# Patient Record
Sex: Male | Born: 1995 | Race: Black or African American | Hispanic: No | Marital: Single | Smoking: Never smoker
Health system: Southern US, Community
[De-identification: ages and names within clinical notes are randomized; demographics above are authoritative.]

---

## 2014-07-28 ENCOUNTER — Ambulatory Visit (INDEPENDENT_AMBULATORY_CARE_PROVIDER_SITE_OTHER): Payer: PRIVATE HEALTH INSURANCE | Admitting: Family Medicine

## 2014-07-28 VITALS — BP 122/72 | HR 70 | Temp 99.2°F | Resp 17 | Ht 68.5 in | Wt 163.0 lb

## 2014-07-28 DIAGNOSIS — Z23 Encounter for immunization: Secondary | ICD-10-CM

## 2014-07-28 DIAGNOSIS — Z0000 Encounter for general adult medical examination without abnormal findings: Secondary | ICD-10-CM

## 2014-07-28 LAB — POCT URINALYSIS DIPSTICK
BILIRUBIN UA: NEGATIVE
Blood, UA: NEGATIVE
GLUCOSE UA: NEGATIVE
Ketones, UA: NEGATIVE
Leukocytes, UA: NEGATIVE
NITRITE UA: NEGATIVE
Protein, UA: NEGATIVE
SPEC GRAV UA: 1.025
UROBILINOGEN UA: 0.2
pH, UA: 6.5

## 2014-07-28 LAB — POCT CBC
GRANULOCYTE PERCENT: 46.1 % (ref 37–80)
HCT, POC: 44.9 % (ref 43.5–53.7)
Hemoglobin: 15.2 g/dL (ref 14.1–18.1)
LYMPH, POC: 2.8 (ref 0.6–3.4)
MCH: 30.5 pg (ref 27–31.2)
MCHC: 33.9 g/dL (ref 31.8–35.4)
MCV: 89.8 fL (ref 80–97)
MID (cbc): 0.2 (ref 0–0.9)
MPV: 6.7 fL (ref 0–99.8)
POC Granulocyte: 2.5 (ref 2–6.9)
POC LYMPH PERCENT: 50.8 %L — AB (ref 10–50)
POC MID %: 3.1 %M (ref 0–12)
Platelet Count, POC: 231 10*3/uL (ref 142–424)
RBC: 5 M/uL (ref 4.69–6.13)
RDW, POC: 12.8 %
WBC: 5.5 10*3/uL (ref 4.6–10.2)

## 2014-07-28 NOTE — Patient Instructions (Addendum)
It was good to meet you today I completed your physical exam form.   Please come in to have your TB skin test (the shot that you got on your arm) read as instructed   We do not have any immunization (shots or vaccines) information for you Please look through any paperwork you may have from when you came to the [place]- you may well find records of shots there Please speak to the health office at [facility].  You may be required to have some immunizations prior to starting school  You got a Tdap, MMR and Hepatitis B vaccine today.  We do not have polio vaccine here- if you need to get this done please contact the Health Department   Children and Adults: [place] and [place]: [phone] (Information in Spanish available) Refugee and Immigrants: [phone]  In [place], we are located at [address]. Please check in at the front desk. In [place], we are located at [address]. Please check in at the front desk.    

## 2014-07-28 NOTE — Addendum Note (Signed)
Addended by: Abbe AmsterdamOPLAND, Armetta Henri C on: 07/28/2014 02:06 PM   Modules accepted: Orders

## 2014-07-28 NOTE — Progress Notes (Addendum)
[facility] [address], [place] [address] [phone]  Date:  07/28/2014   Name:  James Baird   DOB:  10-Jul-1995   MRN:  [id number]  PCP:  No primary care provider on file.    Chief Complaint: Annual Exam   History of Present Illness:  James Baird is a 19 y.o. very pleasant male patient who presents with the following:  Generally healthy young lady man here today seeking a PE for [employer]. He plans to study engineering. He has been in the [place] for about 6 months- he is from [place]. He does not really have any shot records on him but knows that he got some shots as a baby and again when he came to the [place].  However when I asked him again later he did not seem to know if he had an immunizations upon arrival to the [place] He thinks he had BCG vaccine as a newborn  There are no active problems to display for this patient.   No past medical history on file.  No past surgical history on file.  History  Substance Use Topics  . Smoking status: Never Smoker   . Smokeless tobacco: Not on file  . Alcohol Use: Not on file    No family history on file.  No Known Allergies  Medication list has been reviewed and updated.  No current outpatient prescriptions on file prior to visit.   No current facility-administered medications on file prior to visit.    Review of Systems:  As per HPI- otherwise negative.   Physical Examination: Filed Vitals:   07/28/14 1020  BP: 122/72  Pulse: 70  Temp: 99.2 F (37.3 C)  Resp: 17   Filed Vitals:   07/28/14 1020  Height: 5' 8.5" (1.74 m)  Weight: 163 lb (73.936 kg)   Body mass index is 24.42 kg/(m^2). Ideal Body Weight: Weight in (lb) to have BMI = 25: 166.5  GEN: WDWN, NAD, Non-toxic, A & O x 3 HEENT: Atraumatic, Normocephalic. Neck supple. No masses, No LAD. Ears and Nose: No external deformity. CV: RRR, No M/G/R. No JVD. No thrill. No extra heart sounds. PULM: CTA B, no wheezes, crackles,  rhonchi. No retractions. No resp. distress. No accessory muscle use. ABD: S, NT, ND, +BS. No rebound. No HSM. EXTR: No c/c/e NEURO Normal gait.  PSYCH: Normally interactive. Conversant. Not depressed or anxious appearing.  Calm demeanor.   Results for orders placed or performed in visit on 07/28/14  POCT CBC  Result Value Ref Range   WBC 5.5 4.6 - 10.2 K/uL   Lymph, poc 2.8 0.6 - 3.4   POC LYMPH PERCENT 50.8 (A) 10 - 50 %L   MID (cbc) 0.2 0 - 0.9   POC MID % 3.1 0 - 12 %M   POC Granulocyte 2.5 2 - 6.9   Granulocyte percent 46.1 37 - 80 %G   RBC 5.00 4.69 - 6.13 M/uL   Hemoglobin 15.2 14.1 - 18.1 g/dL   HCT, POC 44.9 43.5 - 53.7 %   MCV 89.8 80 - 97 fL   MCH, POC 30.5 27 - 31.2 pg   MCHC 33.9 31.8 - 35.4 g/dL   RDW, POC 12.8 %   Platelet Count, POC 231 142 - 424 K/uL   MPV 6.7 0 - 99.8 fL  POCT urinalysis dipstick  Result Value Ref Range   Color, UA yellow    Clarity, UA clear    Glucose, UA  neg    Bilirubin, UA neg    Ketones, UA neg    Spec Grav, UA 1.025    Blood, UA neg    pH, UA 6.5    Protein, UA neg    Urobilinogen, UA 0.2    Nitrite, UA neg    Leukocytes, UA Negative Negative     Assessment and Plan: Physical exam - Plan: POCT CBC, POCT urinalysis dipstick, TB Skin Test  Well appearing young man.  unfortunately he does not have any immunization records.  I am not sure what is requiredd by [facility].  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   Signed James Blinks, MD  Pt returned- he was told the he needs to have "everything" on his immunization list including tetanus, MMR, and polio.   We do not have polio vaccine here, but will give him a hep B, Tdap and MMR today.  We do not have polio vaccine- if he must have this he needs to visit the health dept.  Gave this contact info

## 2014-07-30 ENCOUNTER — Ambulatory Visit (INDEPENDENT_AMBULATORY_CARE_PROVIDER_SITE_OTHER): Payer: PRIVATE HEALTH INSURANCE | Admitting: Physician Assistant

## 2014-07-30 DIAGNOSIS — R7611 Nonspecific reaction to tuberculin skin test without active tuberculosis: Secondary | ICD-10-CM

## 2014-07-30 NOTE — Progress Notes (Signed)
[facility] [address], [place] [address] [phone]  Date:  07/30/2014   Name:  James Baird   DOB:  1995-05-31   MRN:  [id number]  PCP:  No PCP Per Patient    Chief Complaint: 15cm TB results   History of Present Illness:  This is a 19 y.o. male who is presenting for tb test reading. 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 [place] ago. He has a scar on his left forearm from vaccines given when he was an infant. He states he did get a TB vaccine. This is his first ppd. He needs it for school. He denies cough, SOB, fever, chills, night sweats, hemoptysis.  Review of Systems:  Review of Systems See HPI  There are no active problems to display for this patient.   Prior to Admission medications   Not on File    No Known Allergies  No past surgical history on file.  History  Substance Use Topics  . Smoking status: Never Smoker   . Smokeless tobacco: Not on file  . Alcohol Use: Not on file    No family history on file.  Medication list has been reviewed and updated.  Physical Examination:  Physical Exam  Constitutional: He is oriented to person, place, and time. He appears well-developed and well-nourished. No distress.  HENT:  Head: Normocephalic and atraumatic.  Right Ear: Hearing normal.  Left Ear: Hearing normal.  Nose: Nose normal.  Eyes: Conjunctivae and lids are normal. Right eye exhibits no discharge. Left eye exhibits no discharge. No scleral icterus.  Pulmonary/Chest: Effort normal. No respiratory distress.  Musculoskeletal: Normal range of motion.  Neurological: He is alert and oriented to person, place, and time.  Skin: Skin is warm, dry and intact. No lesion and no rash noted.  Psychiatric: He has a normal mood and affect. His speech is normal and behavior is normal. Thought content normal.   There were no vitals taken for this visit.  Assessment and  Plan:  1. 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 Will obtain quantiferon gold assay today. If that is positive, will obtain CXR and send to health department. He is currently asymptomatic. - Quantiferon tb gold assay (blood)   Roswell MinersNicole V. Dyke BrackettBush, PA-C, MHS [facility]  07/30/2014

## 2014-07-31 LAB — QUANTIFERON TB GOLD ASSAY (BLOOD)
Interferon Gamma Release Assay: POSITIVE — AB
Mitogen value: >10 IU/mL
QUANTIFERON NIL VALUE: 0.09 [IU]/mL
Quantiferon Tb Ag Minus Nil Value: 1.27 IU/mL
TB Ag value: 1.36 IU/mL

## 2014-08-02 ENCOUNTER — Ambulatory Visit (INDEPENDENT_AMBULATORY_CARE_PROVIDER_SITE_OTHER): Payer: PRIVATE HEALTH INSURANCE

## 2014-08-02 ENCOUNTER — Ambulatory Visit (INDEPENDENT_AMBULATORY_CARE_PROVIDER_SITE_OTHER): Payer: PRIVATE HEALTH INSURANCE | Admitting: Family Medicine

## 2014-08-02 VITALS — BP 128/68 | HR 66 | Temp 99.0°F | Ht 68.5 in | Wt 161.1 lb

## 2014-08-02 DIAGNOSIS — R7612 Nonspecific reaction to cell mediated immunity measurement of gamma interferon antigen response without active tuberculosis: Secondary | ICD-10-CM

## 2014-08-02 NOTE — Progress Notes (Signed)
[facility] [address], [place] [address] [phone]  Date:  08/02/2014   Name:  James PontesGlody Ceci   DOB:  May 10, 1995   MRN:  [id number]  PCP:  No PCP Per Patient    Chief Complaint: Results   History of Present Illness:  This is a 19 y.o. male who is presenting for follow up of positive quantiferon gold assay. He was originally seen here 5 days ago to get vaccines and ppd placed for school. [place] is from [place] and has [place] in the [place] now [place] 6 months. PPD was positive with 15 mm induration. Pt had BCG vaccine as an infant. Quant gold obtained and positive. Here today for chest xrays. He is asymptomatic - denies CP, cough, hemoptysis, fever, chills, night sweats.  Review of Systems:  Review of Systems See HPI  There are no active problems to display for this patient.   Prior to Admission medications   Not on File    No Known Allergies  No past surgical history on file.  History  Substance Use Topics  . Smoking status: Never Smoker   . Smokeless tobacco: Not on file  . Alcohol Use: Not on file    No family history on file.  Medication list has been reviewed and updated.  Physical Examination:  Physical Exam  Constitutional: He is oriented to person, place, and time. He appears well-developed and well-nourished. No distress.  HENT:  Head: Normocephalic and atraumatic.  Right Ear: Hearing normal.  Left Ear: Hearing normal.  Nose: Nose normal.  Mouth/Throat: Uvula is midline, oropharynx is clear and moist and mucous membranes are normal.  Eyes: Conjunctivae and lids are normal. Right eye exhibits no discharge. Left eye exhibits no discharge. No scleral icterus.  Cardiovascular: Normal rate, regular rhythm, normal heart sounds and normal pulses.   No murmur heard. Pulmonary/Chest: Effort normal and breath sounds normal. No respiratory distress. He has no wheezes. He has no rhonchi. He has no rales.  Musculoskeletal: Normal range of  motion.  Neurological: He is alert and oriented to person, place, and time.  Skin: Skin is warm, dry and intact. No lesion and no rash noted.  Psychiatric: He has a normal mood and affect. His speech is normal and behavior is normal. Thought content normal.   BP 128/68 mmHg  Pulse 66  Temp(Src) 99 F (37.2 C) (Oral)  Ht 5' 8.5" (1.74 m)  Wt 161 lb 2 oz (73.086 kg)  BMI 24.14 kg/m2  SpO2 93%  [facility] reading (PRIMARY) by  Dr. Clelia CroftShaw: negative.   Assessment and Plan:  1. Positive QuantiFERON-TB Gold test Chest radiograph negative. Burned CDs of chest xrays for pt have. Will send message to health department informing of positive TB tests and they will contact for discussion of treatment.  - DG Chest 2 View; Future   Roswell MinersNicole V. Dyke BrackettBush, PA-C, MHS [facility]  08/02/2014

## 2014-08-02 NOTE — Patient Instructions (Signed)
You will be contacted by health department. When you go to the health department, take CD of your chest xray with you. They will let you know what you need to do for treatment. Return with further problems/concerns.

## 2014-08-09 NOTE — Progress Notes (Signed)
Patient ID: James Baird, male   DOB: May 30, 1995, 19 y.o.   MRN: [id number] Reviewed documentation and xray and agree w/ assessment and plan. Norberto Sorenson, MD MPH

## 2016-02-25 ENCOUNTER — Ambulatory Visit (INDEPENDENT_AMBULATORY_CARE_PROVIDER_SITE_OTHER): Payer: Self-pay | Admitting: Family Medicine

## 2016-02-25 VITALS — BP 120/82 | HR 78 | Temp 99.2°F | Resp 16 | Ht 68.0 in | Wt 185.8 lb

## 2016-02-25 DIAGNOSIS — R079 Chest pain, unspecified: Secondary | ICD-10-CM

## 2016-02-25 DIAGNOSIS — K219 Gastro-esophageal reflux disease without esophagitis: Secondary | ICD-10-CM | POA: Insufficient documentation

## 2016-02-25 DIAGNOSIS — R52 Pain, unspecified: Secondary | ICD-10-CM

## 2016-02-25 MED ORDER — OMEPRAZOLE 20 MG PO CPDR: 20.0000 mg | DELAYED_RELEASE_CAPSULE | Freq: Every day | ORAL | 1 refills | Status: DC

## 2016-02-25 NOTE — Patient Instructions (Addendum)
     IF you received an x-ray today, you will receive an invoice from [facility]. Please contact [facility] at [phone] with questions or concerns regarding your invoice.   IF you received labwork today, you will receive an invoice from [facility]. Please contact [facility] at [phone] with questions or concerns regarding your invoice.   Our billing staff will not be able to assist you with questions regarding bills from these companies.  You will be contacted with the lab results as soon as they are available. The fastest way to get your results is to activate your My Chart account. Instructions are located on the last page of this paperwork. If you have not heard from us regarding the results in 2 weeks, please contact this office.      Food Choices for Gastroesophageal Reflux Disease, Adult When you have gastroesophageal reflux disease (GERD), the foods you eat and your eating habits are very important. Choosing the right foods can help ease your discomfort. What guidelines do I need to follow?  Choose fruits, vegetables, whole grains, and low-fat dairy products.  Choose low-fat meat, fish, and poultry.  Limit fats such as oils, salad dressings, butter, nuts, and avocado.  Keep a food diary. This helps you identify foods that cause symptoms.  Avoid foods that cause symptoms. These may be different for everyone.  Eat small meals often instead of 3 large meals a day.  Eat your meals slowly, in a place where you are relaxed.  Limit fried foods.  Cook foods using methods other than frying.  Avoid drinking alcohol.  Avoid drinking large amounts of liquids with your meals.  Avoid bending over or lying down until 2-3 hours after eating. What foods are not recommended? These are some foods and drinks that may make your symptoms worse: Vegetables Tomatoes. Tomato juice. Tomato and spaghetti sauce. Chili peppers. Onion and garlic.  Horseradish. Fruits Oranges, grapefruit, and lemon (fruit and juice). Meats High-fat meats, fish, and poultry. This includes hot dogs, ribs, ham, sausage, salami, and bacon. Dairy Whole milk and chocolate milk. Sour cream. Cream. Butter. Ice cream. Cream cheese. Drinks Coffee and tea. Bubbly (carbonated) drinks or energy drinks. Condiments Hot sauce. Barbecue sauce. Sweets/Desserts Chocolate and cocoa. Donuts. Peppermint and spearmint. Fats and Oils High-fat foods. This includes French fries and potato chips. Other Vinegar. Strong spices. This includes black pepper, white pepper, red pepper, cayenne, curry powder, cloves, ginger, and chili powder. The items listed above may not be a complete list of foods and drinks to avoid. Contact your dietitian for more information. This information is not intended to replace advice given to you by your health care provider. Make sure you discuss any questions you have with your health care provider. Document Released: 07/03/2011 Document Revised: 06/09/2015 Document Reviewed: 11/05/2012 Elsevier Interactive Patient Education  2017 Elsevier Inc.  

## 2016-02-25 NOTE — Progress Notes (Signed)
Chief Complaint  Patient presents with  . Chest pain    x 3 days, "1 or 2 hours after eating"    HPI  Pt reports that he gets chest pain after eating typically within 1-2 hours Reports that for the past 24 hours he has been having pain that is in the jaw on the left side  Typically he notices it after eating that he has chest pain 1-2 hours later He reports that it is typically after a big meal He reports that he chews his food well He reports that it does not matter the content of the food it seems to be more about the quantity.   He denies diaphoresis or nausea  No family history of heart disease He is a non smoker  He reports that after eating he does not feel like he is going to vomiting  His last meal today was at 9am.  Pain is not affected by drinking or deep breaths or activity He reports that he has pain at night while sleeping but it doesn't last all night  He does not take medications like aspirin or nsaids  He reports that he had recent stress because his brother died suddenly a week ago.  His death was not due to heart disease.  No past medical history on file.  Current Outpatient Prescriptions  Medication Sig Dispense Refill  . OVER THE COUNTER MEDICATION OTC antacid    . omeprazole (PRILOSEC) 20 MG capsule Take 1 capsule (20 mg total) by mouth daily. Before breakfast 30 capsule 1   No current facility-administered medications for this visit.     Allergies: No Known Allergies  No past surgical history on file.  Social History   Social History  . Marital status: Single    Spouse name: N/A  . Number of children: N/A  . Years of education: N/A   Social History Main Topics  . Smoking status: Never Smoker  . Smokeless tobacco: Never Used  . Alcohol use None     Comment: 3-4 bottles a month(beer)  . Drug use: No  . Sexual activity: Not Asked   Other Topics Concern  . None   Social History Narrative  . None    ROS See  hpi  Objective: Vitals:   02/25/16 1424  BP: 120/82  Pulse: 78  Resp: 16  Temp: 99.2 F (37.3 C)  TempSrc: Oral  SpO2: 100%  Weight: 185 lb 12.8 oz (84.3 kg)  Height: 5\' 8"  (1.727 m)    Physical Exam  Constitutional: He is oriented to person, place, and time. He appears well-developed and well-nourished.  HENT:  Head: Normocephalic and atraumatic.  Right Ear: External ear normal.  Left Ear: External ear normal.  Eyes: Conjunctivae and EOM are normal.  Neck: Normal range of motion. No thyromegaly present.  Cardiovascular: Normal rate, regular rhythm and normal heart sounds.   No murmur heard. Pulmonary/Chest: Effort normal and breath sounds normal. No respiratory distress. He has no wheezes.  Abdominal: Soft. Bowel sounds are normal. He exhibits no distension and no mass. There is no tenderness. There is no rebound and no guarding. No hernia.  Neurological: He is alert and oriented to person, place, and time.  Skin: Skin is warm. Capillary refill takes less than 2 seconds. No erythema.   EKG: nsr, no st elevation, no twi  Assessment and Plan Ariz was seen today for chest pain.  Diagnoses and all orders for this visit:  Chest pain radiating to jaw-  discussed that his exam shows TMJ symptoms Would not recommend NSAIDs right now due to his likely reflux symptoms -     EKG 12-Lead  Pain aggravated by eating or drinking- likely reflux  Gastroesophageal reflux disease without esophagitis- Discussed that since his ECG did not show ischemic changes and no acute changes we can treat him for reflux with omeprazole and six week follow up  -     omeprazole (PRILOSEC) 20 MG capsule; Take 1 capsule (20 mg total) by mouth daily. Before breakfast     Ajdin Macke A Creta LevinStallings

## 2016-04-04 ENCOUNTER — Encounter: Payer: Self-pay | Admitting: Family Medicine

## 2016-04-04 ENCOUNTER — Ambulatory Visit (INDEPENDENT_AMBULATORY_CARE_PROVIDER_SITE_OTHER): Payer: Self-pay | Admitting: Family Medicine

## 2016-04-04 VITALS — BP 132/70 | HR 82 | Temp 98.8°F | Resp 16 | Ht 68.0 in | Wt 179.0 lb

## 2016-04-04 DIAGNOSIS — R1013 Epigastric pain: Secondary | ICD-10-CM

## 2016-04-04 DIAGNOSIS — K219 Gastro-esophageal reflux disease without esophagitis: Secondary | ICD-10-CM

## 2016-04-04 MED ORDER — OMEPRAZOLE 20 MG PO CPDR: 20.0000 mg | DELAYED_RELEASE_CAPSULE | Freq: Every day | ORAL | 6 refills | Status: DC

## 2016-04-04 NOTE — Patient Instructions (Addendum)
Gastroesophageal Reflux Disease, Adult Normally, food travels down the esophagus and stays in the stomach to be digested. However, when a person has gastroesophageal reflux disease (GERD), food and stomach acid move back up into the esophagus. When this happens, the esophagus becomes sore and inflamed. Over time, GERD can create small holes (ulcers) in the lining of the esophagus. What are the causes? This condition is caused by a problem with the muscle between the esophagus and the stomach (lower esophageal sphincter, or LES). Normally, the LES muscle closes after food passes through the esophagus to the stomach. When the LES is weakened or abnormal, it does not close properly, and that allows food and stomach acid to go back up into the esophagus. The LES can be weakened by certain dietary substances, medicines, and medical conditions, including:  Tobacco use.  Pregnancy.  Having a hiatal hernia.  Heavy alcohol use.  Certain foods and beverages, such as coffee, chocolate, onions, and peppermint.  What increases the risk? This condition is more likely to develop in:  People who have an increased body weight.  People who have connective tissue disorders.  People who use NSAID medicines.  What are the signs or symptoms? Symptoms of this condition include:  Heartburn.  Difficult or painful swallowing.  The feeling of having a lump in the throat.  Abitter taste in the mouth.  Bad breath.  Having a large amount of saliva.  Having an upset or bloated stomach.  Belching.  Chest pain.  Shortness of breath or wheezing.  Ongoing (chronic) cough or a night-time cough.  Wearing away of tooth enamel.  Weight loss.  Different conditions can cause chest pain. Make sure to see your health care provider if you experience chest pain. How is this diagnosed? Your health care provider will take a medical history and perform a physical exam. To determine if you have mild or severe  GERD, your health care provider may also monitor how you respond to treatment. You may also have other tests, including:  An endoscopy toexamine your stomach and esophagus with a small camera.  A test thatmeasures the acidity level in your esophagus.  A test thatmeasures how much pressure is on your esophagus.  A barium swallow or modified barium swallow to show the shape, size, and functioning of your esophagus.  How is this treated? The goal of treatment is to help relieve your symptoms and to prevent complications. Treatment for this condition may vary depending on how severe your symptoms are. Your health care provider may recommend:  Changes to your diet.  Medicine.  Surgery.  Follow these instructions at home: Diet  Follow a diet as recommended by your health care provider. This may involve avoiding foods and drinks such as: ? Coffee and tea (with or without caffeine). ? Drinks that containalcohol. ? Energy drinks and sports drinks. ? Carbonated drinks or sodas. ? Chocolate and cocoa. ? Peppermint and mint flavorings. ? Garlic and onions. ? Horseradish. ? Spicy and acidic foods, including peppers, chili powder, curry powder, vinegar, hot sauces, and barbecue sauce. ? Citrus fruit juices and citrus fruits, such as oranges, lemons, and limes. ? Tomato-based foods, such as red sauce, chili, salsa, and pizza with red sauce. ? Fried and fatty foods, such as donuts, french fries, potato chips, and high-fat dressings. ? High-fat meats, such as hot dogs and fatty cuts of red and white meats, such as rib eye steak, sausage, ham, and bacon. ? High-fat dairy items, such as whole milk,   butter, and cream cheese.  Eat small, frequent meals instead of large meals.  Avoid drinking large amounts of liquid with your meals.  Avoid eating meals during the 2-3 hours before bedtime.  Avoid lying down right after you eat.  Do not exercise right after you eat. General  instructions  Pay attention to any changes in your symptoms.  Take over-the-counter and prescription medicines only as told by your health care provider. Do not take aspirin, ibuprofen, or other NSAIDs unless your health care provider told you to do so.  Do not use any tobacco products, including cigarettes, chewing tobacco, and e-cigarettes. If you need help quitting, ask your health care provider.  Wear loose-fitting clothing. Do not wear anything tight around your waist that causes pressure on your abdomen.  Raise (elevate) the head of your bed 6 inches (15cm).  Try to reduce your stress, such as with yoga or meditation. If you need help reducing stress, ask your health care provider.  If you are overweight, reduce your weight to an amount that is healthy for you. Ask your health care provider for guidance about a safe weight loss goal.  Keep all follow-up visits as told by your health care provider. This is important. Contact a health care provider if:  You have new symptoms.  You have unexplained weight loss.  You have difficulty swallowing, or it hurts to swallow.  You have wheezing or a persistent cough.  Your symptoms do not improve with treatment.  You have a hoarse voice. Get help right away if:  You have pain in your arms, neck, jaw, teeth, or back.  You feel sweaty, dizzy, or light-headed.  You have chest pain or shortness of breath.  You vomit and your vomit looks like blood or coffee grounds.  You faint.  Your stool is bloody or black.  You cannot swallow, drink, or eat. This information is not intended to replace advice given to you by your health care provider. Make sure you discuss any questions you have with your health care provider. Document Released: 10/11/2004 Document Revised: 06/01/2015 Document Reviewed: 04/28/2014 Elsevier Interactive Patient Education  2017 Elsevier Inc.  

## 2016-04-04 NOTE — Progress Notes (Signed)
  Chief Complaint  Patient presents with  . Follow-up    gotten better but still feels pressure at least 4 times a week    HPI   Patient reports that the chest pain resolved with omeprazole and nsaid There is no radiation to the jaw but now he has noted a sensation in the "chest like a solid thing about 4 times a week" He does not know how to describe it It is there before going to sleep at night Does not seem to be related to what he eats The sensation moves around and is sometimes in the right upper quadrant, the epigastrium, the left upper quadrant. The omeprazole seemed to help his pain. There is no radiation to the back. Not aggravated by activity.    History reviewed. No pertinent past medical history.  Current Outpatient Prescriptions  Medication Sig Dispense Refill  . omeprazole (PRILOSEC) 20 MG capsule Take 1 capsule (20 mg total) by mouth daily. Before breakfast 30 capsule 6  . OVER THE COUNTER MEDICATION OTC antacid     No current facility-administered medications for this visit.     Allergies: No Known Allergies  History reviewed. No pertinent surgical history.  Social History   Social History  . Marital status: Single    Spouse name: N/A  . Number of children: N/A  . Years of education: N/A   Social History Main Topics  . Smoking status: Never Smoker  . Smokeless tobacco: Never Used  . Alcohol use None     Comment: 3-4 bottles a month(beer)  . Drug use: No  . Sexual activity: Not Asked   Other Topics Concern  . None   Social History Narrative  . None    ROS  Objective: Vitals:   04/04/16 1217  BP: 132/70  Pulse: 82  Resp: 16  Temp: 98.8 F (37.1 C)  TempSrc: Oral  SpO2: 99%  Weight: 179 lb (81.2 kg)  Height: 5\' 8"  (1.727 m)    Physical Exam  Constitutional: He is oriented to person, place, and time. He appears well-developed and well-nourished.  HENT:  Head: Normocephalic and atraumatic.  Eyes: Conjunctivae and EOM are normal.    Neck: Normal range of motion. Neck supple.  Cardiovascular: Normal rate, regular rhythm, normal heart sounds and intact distal pulses.   No murmur heard. Pulmonary/Chest: Effort normal and breath sounds normal. No respiratory distress. He has no wheezes. He has no rales.  Abdominal: Soft. Bowel sounds are normal. He exhibits no distension and no mass. There is no tenderness. There is no guarding.  Neurological: He is alert and oriented to person, place, and time.  Skin: Skin is warm. No erythema.    Assessment and Plan [name] was seen today for follow-up.  Diagnoses and all orders for this visit:  Abdominal discomfort, epigastric- will screen for anemia and h. Pylori This is likely dyspepsia since it resolves with omeprazole -     H. pylori breath test -     CBC with Differential/Platelet -     Basic metabolic panel  Gastroesophageal reflux disease without esophagitis Continue omeprazole since symptoms resolved with omeprazole -     omeprazole (PRILOSEC) 20 MG capsule; Take 1 capsule (20 mg total) by mouth daily. Before breakfast     Lelend Heinecke A Creta LevinStallings

## 2016-04-05 LAB — CBC WITH DIFFERENTIAL/PLATELET
BASOS ABS: 0 10*3/uL (ref 0.0–0.2)
BASOS: 0 %
EOS (ABSOLUTE): 0 10*3/uL (ref 0.0–0.4)
Eos: 1 %
Hematocrit: 44.8 % (ref 37.5–51.0)
Hemoglobin: 14.2 g/dL (ref 13.0–17.7)
IMMATURE GRANS (ABS): 0 10*3/uL (ref 0.0–0.1)
IMMATURE GRANULOCYTES: 0 %
LYMPHS: 53 %
Lymphocytes Absolute: 2.7 10*3/uL (ref 0.7–3.1)
MCH: 28.7 pg (ref 26.6–33.0)
MCHC: 31.7 g/dL (ref 31.5–35.7)
MCV: 91 fL (ref 79–97)
MONOCYTES: 6 %
Monocytes Absolute: 0.3 10*3/uL (ref 0.1–0.9)
NEUTROS PCT: 40 %
Neutrophils Absolute: 2.1 10*3/uL (ref 1.4–7.0)
PLATELETS: 254 10*3/uL (ref 150–379)
RBC: 4.95 x10E6/uL (ref 4.14–5.80)
RDW: 13.4 % (ref 12.3–15.4)
WBC: 5.1 10*3/uL (ref 3.4–10.8)

## 2016-04-05 LAB — BASIC METABOLIC PANEL
BUN/Creatinine Ratio: 11 (ref 9–20)
BUN: 10 mg/dL (ref 6–20)
CALCIUM: 9.6 mg/dL (ref 8.7–10.2)
CHLORIDE: 100 mmol/L (ref 96–106)
CO2: 25 mmol/L (ref 18–29)
Creatinine, Ser: 0.95 mg/dL (ref 0.76–1.27)
GFR calc Af Amer: 131 mL/min/{1.73_m2} (ref >59)
GFR calc non Af Amer: 113 mL/min/{1.73_m2} (ref >59)
GLUCOSE: 75 mg/dL (ref 65–99)
Potassium: 4.5 mmol/L (ref 3.5–5.2)
Sodium: 139 mmol/L (ref 134–144)

## 2016-04-06 LAB — H. PYLORI BREATH TEST

## 2016-04-06 LAB — H.PYLORI BREATH TEST (REFLEX): H. PYLORI BREATH TEST: POSITIVE — AB

## 2016-04-15 ENCOUNTER — Other Ambulatory Visit: Payer: Self-pay | Admitting: Family Medicine

## 2016-04-15 MED ORDER — AMOXICILLIN 500 MG PO TABS: 1000.0000 mg | ORAL_TABLET | Freq: Two times a day (BID) | ORAL | 0 refills | Status: AC

## 2016-04-15 MED ORDER — CLARITHROMYCIN 500 MG PO TABS: 500.0000 mg | ORAL_TABLET | Freq: Two times a day (BID) | ORAL | 0 refills | Status: AC

## 2016-06-14 ENCOUNTER — Encounter: Payer: Self-pay | Admitting: Physician Assistant

## 2016-06-14 ENCOUNTER — Ambulatory Visit (INDEPENDENT_AMBULATORY_CARE_PROVIDER_SITE_OTHER): Payer: Self-pay | Admitting: Physician Assistant

## 2016-06-14 ENCOUNTER — Ambulatory Visit (INDEPENDENT_AMBULATORY_CARE_PROVIDER_SITE_OTHER): Payer: Self-pay

## 2016-06-14 VITALS — BP 121/77 | HR 110 | Temp 98.8°F | Resp 16 | Ht 68.0 in | Wt 166.2 lb

## 2016-06-14 DIAGNOSIS — R1011 Right upper quadrant pain: Secondary | ICD-10-CM

## 2016-06-14 DIAGNOSIS — R197 Diarrhea, unspecified: Secondary | ICD-10-CM

## 2016-06-14 LAB — POCT CBC
GRANULOCYTE PERCENT: 62.1 % (ref 37–80)
HEMATOCRIT: 44.1 % (ref 43.5–53.7)
Hemoglobin: 15.6 g/dL (ref 14.1–18.1)
Lymph, poc: 2.7 (ref 0.6–3.4)
MCH: 30.8 pg (ref 27–31.2)
MCHC: 35.4 g/dL (ref 31.8–35.4)
MCV: 87.1 fL (ref 80–97)
MID (CBC): 0.3 (ref 0–0.9)
MPV: 6.8 fL (ref 0–99.8)
POC GRANULOCYTE: 4.9 (ref 2–6.9)
POC LYMPH PERCENT: 33.7 %L (ref 10–50)
POC MID %: 4.2 %M (ref 0–12)
Platelet Count, POC: 287 10*3/uL (ref 142–424)
RBC: 5.06 M/uL (ref 4.69–6.13)
RDW, POC: 13.1 %
WBC: 7.9 10*3/uL (ref 4.6–10.2)

## 2016-06-14 LAB — POCT URINALYSIS DIP (MANUAL ENTRY)
GLUCOSE UA: NEGATIVE mg/dL
Leukocytes, UA: NEGATIVE
Nitrite, UA: NEGATIVE
Protein Ur, POC: 100 mg/dL — AB
RBC UA: NEGATIVE
SPEC GRAV UA: 1.025 (ref 1.010–1.025)
Urobilinogen, UA: 1 E.U./dL
pH, UA: 6 (ref 5.0–8.0)

## 2016-06-14 MED ORDER — LOPERAMIDE HCL 2 MG PO TABS: 2.0000 mg | ORAL_TABLET | Freq: Four times a day (QID) | ORAL | 0 refills | Status: AC | PRN

## 2016-06-14 NOTE — Patient Instructions (Signed)
     IF you received an x-ray today, you will receive an invoice from [facility]. Please contact [facility] at [phone] with questions or concerns regarding your invoice.   IF you received labwork today, you will receive an invoice from [facility]. Please contact [facility] at [phone] with questions or concerns regarding your invoice.   Our billing staff will not be able to assist you with questions regarding bills from these companies.  You will be contacted with the lab results as soon as they are available. The fastest way to get your results is to activate your My Chart account. Instructions are located on the last page of this paperwork. If you have not heard from us regarding the results in 2 weeks, please contact this office.     

## 2016-06-14 NOTE — Progress Notes (Signed)
fina  

## 2016-06-14 NOTE — Progress Notes (Signed)
06/14/2016 2:55 PM   DOB: 26-Oct-1995 / MRN: [id number]  SUBJECTIVE:  James Baird is a 21 y.o. male presenting for two weeks of diarrhea.  Says he is stooling five times daily. He has tried pepto bismal without relief.  Has not had any foods that could have caused this. This is a completely new problem for him.  Say the his will sometimes have a pressure in the right upper qudrant.  He is not eating well because he worries that he will have diarrhea.  There is no blood in the diarrhea. Denies GERD.  Denies a family history of IBD. He is not worse or better. No antibiotic in the last 6 weeks. He thinks he has probably lost about 22 lbs in the last three months.     He has No Known Allergies.   He  has no past medical history on file.    He  reports that he has never smoked. He has never used smokeless tobacco. He reports that he does not use drugs. He  has no sexual activity history on file. The patient  has no past surgical history on file.  His family history is not on file.  Review of Systems  Constitutional: Negative for chills, diaphoresis and fever.  Gastrointestinal: Positive for diarrhea. Negative for abdominal pain, blood in stool, constipation, heartburn, melena, nausea and vomiting.  Genitourinary: Negative for flank pain.  Skin: Negative for rash.  Neurological: Negative for dizziness.    The problem list and medications were reviewed and updated by myself where necessary and exist elsewhere in the encounter.   OBJECTIVE:  BP 121/77   Pulse (!) 110   Temp 98.8 F (37.1 C) (Oral)   Resp 16   Ht '5\' 8"'  (1.727 m)   Wt 166 lb 3.2 oz (75.4 kg)   SpO2 100%   BMI 25.27 kg/m   Physical Exam  Constitutional: He appears well-developed. He is active and cooperative.  Non-toxic appearance.  Cardiovascular: Normal rate.   Pulmonary/Chest: Effort normal. No tachypnea.  Abdominal: Soft. Normal appearance and bowel sounds are normal. He exhibits no distension and no mass. There is  no tenderness. There is no rigidity, no rebound, no guarding and no CVA tenderness. No hernia.  Neurological: He is alert.  Skin: Skin is warm and dry. He is not diaphoretic. No pallor.  Vitals reviewed.   Results for orders placed or performed in visit on 06/14/16 (from the past 72 hour(s))  POCT CBC     Status: None   Collection Time: 06/14/16  2:06 PM  Result Value Ref Range   WBC 7.9 4.6 - 10.2 K/uL   Lymph, poc 2.7 0.6 - 3.4   POC LYMPH PERCENT 33.7 10 - 50 %L   MID (cbc) 0.3 0 - 0.9   POC MID % 4.2 0 - 12 %M   POC Granulocyte 4.9 2 - 6.9   Granulocyte percent 62.1 37 - 80 %G   RBC 5.06 4.69 - 6.13 M/uL   Hemoglobin 15.6 14.1 - 18.1 g/dL   HCT, POC 44.1 43.5 - 53.7 %   MCV 87.1 80 - 97 fL   MCH, POC 30.8 27 - 31.2 pg   MCHC 35.4 31.8 - 35.4 g/dL   RDW, POC 13.1 %   Platelet Count, POC 287 142 - 424 K/uL   MPV 6.8 0 - 99.8 fL  POCT urinalysis dipstick     Status: Abnormal   Collection Time: 06/14/16  2:26 PM  Result Value  Ref Range   Color, UA yellow yellow   Clarity, UA clear clear   Glucose, UA negative negative mg/dL   Bilirubin, UA small (A) negative   Ketones, POC UA small (15) (A) negative mg/dL   Spec Grav, UA 1.025 1.010 - 1.025   Blood, UA negative negative   pH, UA 6.0 5.0 - 8.0   Protein Ur, POC =100 (A) negative mg/dL   Urobilinogen, UA 1.0 0.2 or 1.0 E.U./dL   Nitrite, UA Negative Negative   Leukocytes, UA Negative Negative    Dg Abd 2 Views  Result Date: 06/14/2016 CLINICAL DATA:  21 year old male with 2 weeks of generalized abdominal pain. Right upper quadrant abdominal pain. EXAM: ABDOMEN - 2 VIEW COMPARISON:  Chest radiographs 08/02/2014. FINDINGS: Upright and supine views. Normal lung bases. No pneumoperitoneum. Rounded metallic wire type density projects over the lower mediastinum and is new since 2016. This might be external artifact. Normal bowel gas pattern. Normal abdominal and pelvic visceral contours. Mild dextroconvex thoracic spine  curvature. Several small dystrophic calcifications in the left hemipelvis, probably phleboliths. No acute osseous abnormality identified. IMPRESSION: 1. Negative abdomen.  Normal bowel gas pattern, no free air. 2. Small rounded metallic wire type density projecting over the lower mediastinum is new since 2016 and may be external artifact. Electronically Signed   By: Genevie Ann M.D.   On: 06/14/2016 14:19    ASSESSMENT AND PLAN:  [name] was seen today for diarrhea, abdominal pain and other.  Diagnoses and all orders for this visit:  Right upper quadrant abdominal pain: No tenderness.  See problem 2.  -     POCT CBC -     POCT urinalysis dipstick -     DG Abd 2 Views; Future -     Care order/instruction:  Diarrhea of presumed infectious origin: He did just complete H. Pylori eradication.  Will check for C diff.  Not rechecking the lab as he is self pay.  -     GI Profile, Stool, PCR -     loperamide (IMODIUM A-D) 2 MG tablet; Take 1-2 tablets (2-4 mg total) by mouth 4 (four) times daily as needed for diarrhea or loose stools. -     CMP14+EGFR    The patient is advised to call or return to clinic if he does not see an improvement in symptoms, or to seek the care of the closest emergency department if he worsens with the above plan.   Philis Fendt, MHS, PA-C [facility] 06/14/2016 2:55 PM

## 2016-06-15 ENCOUNTER — Encounter: Payer: Self-pay | Admitting: Radiology

## 2016-06-15 ENCOUNTER — Encounter: Payer: Self-pay | Admitting: *Deleted

## 2016-06-15 LAB — CMP14+EGFR
A/G RATIO: 1.5 (ref 1.2–2.2)
ALBUMIN: 4.5 g/dL (ref 3.5–5.5)
ALK PHOS: 74 IU/L (ref 39–117)
ALT: 11 IU/L (ref 0–44)
AST: 15 IU/L (ref 0–40)
BILIRUBIN TOTAL: 0.3 mg/dL (ref 0.0–1.2)
BUN / CREAT RATIO: 11 (ref 9–20)
BUN: 11 mg/dL (ref 6–20)
CHLORIDE: 100 mmol/L (ref 96–106)
CO2: 26 mmol/L (ref 18–29)
Calcium: 9.4 mg/dL (ref 8.7–10.2)
Creatinine, Ser: 0.97 mg/dL (ref 0.76–1.27)
GFR calc Af Amer: 128 mL/min/{1.73_m2} (ref >59)
GFR calc non Af Amer: 110 mL/min/{1.73_m2} (ref >59)
GLOBULIN, TOTAL: 3 g/dL (ref 1.5–4.5)
GLUCOSE: 138 mg/dL — AB (ref 65–99)
POTASSIUM: 4.1 mmol/L (ref 3.5–5.2)
SODIUM: 139 mmol/L (ref 134–144)
Total Protein: 7.5 g/dL (ref 6.0–8.5)

## 2016-06-15 NOTE — Progress Notes (Signed)
Please make patient aware of results via letter. In the context of his overall presentation any abnormal values are of no clinical significance.  Deliah BostonMichael Lawanda Holzheimer PA-C, 06/15/2016 9:31 AM

## 2016-06-18 ENCOUNTER — Other Ambulatory Visit: Payer: Self-pay | Admitting: Physician Assistant

## 2016-06-18 LAB — GI PROFILE, STOOL, PCR
Adenovirus F 40/41: NOT DETECTED
Astrovirus: NOT DETECTED
C DIFFICILE TOXIN A/B: DETECTED — AB
CAMPYLOBACTER: NOT DETECTED
CRYPTOSPORIDIUM: NOT DETECTED
Cyclospora cayetanensis: NOT DETECTED
ENTEROTOXIGENIC E COLI: NOT DETECTED
Entamoeba histolytica: NOT DETECTED
Enteroaggregative E coli: NOT DETECTED
Enteropathogenic E coli: NOT DETECTED
Giardia lamblia: NOT DETECTED
NOROVIRUS GI/GII: NOT DETECTED
PLESIOMONAS SHIGELLOIDES: NOT DETECTED
ROTAVIRUS A: NOT DETECTED
SALMONELLA: NOT DETECTED
SHIGA-TOXIN-PRODUCING E COLI: NOT DETECTED
SHIGELLA/ENTEROINVASIVE E COLI: NOT DETECTED
Sapovirus: NOT DETECTED
Vibrio cholerae: NOT DETECTED
Vibrio: NOT DETECTED
YERSINIA ENTEROCOLITICA: NOT DETECTED

## 2016-06-18 MED ORDER — METRONIDAZOLE 500 MG PO TABS: 500.0000 mg | ORAL_TABLET | Freq: Three times a day (TID) | ORAL | 0 refills | Status: DC

## 2016-06-18 NOTE — Progress Notes (Signed)
Patient with positive C. Diff. Metro TID called into the pharmacy. Deliah BostonMichael Mashal Slavick, MS, PA-C 2:11 PM, 06/18/2016

## 2016-06-18 NOTE — Progress Notes (Signed)
No need to call him. I spoke to him on the phone regarding the positive C. Diff.  He continues to have abdominal pain but tells me that he is no longer having diarrhea 2/2 immodium.  Starting metronidazole as he is not severely ill per last labs and he has no insurance making PO vanc cost prohibitive. Patient agreed to go straight to the pharmacy and pick up his medication. He will come back in one week for re-eval. Deliah BostonMichael Clark, MS, PA-C 5:35 PM, 06/18/2016

## 2016-06-25 ENCOUNTER — Ambulatory Visit (INDEPENDENT_AMBULATORY_CARE_PROVIDER_SITE_OTHER): Payer: Self-pay | Admitting: Physician Assistant

## 2016-06-25 VITALS — BP 117/77 | HR 72 | Temp 97.8°F | Resp 18 | Ht 68.0 in | Wt 166.2 lb

## 2016-06-25 DIAGNOSIS — A0472 Enterocolitis due to Clostridium difficile, not specified as recurrent: Secondary | ICD-10-CM

## 2016-06-25 NOTE — Progress Notes (Signed)
06/25/2016 5:09 PM   DOB: Oct 13, 1995 / MRN: [id number]  SUBJECTIVE:  James Baird is a 21 y.o. male presenting for recheck of C. Diff.  Tells me that overall he is 80% better.  He has been taking metronidazole TID for 6 days now.  He has three days to go.  He tells me that his stomach is mildly sore about the lower abdomen, but this is continuing to improve.   He has No Known Allergies.   He  has no past medical history on file.    He  reports that he has never smoked. He has never used smokeless tobacco. He reports that he does not use drugs. He  has no sexual activity history on file. The patient  has no past surgical history on file.  His family history is not on file.  Review of Systems  Constitutional: Negative for chills and fever.  Gastrointestinal: Negative for constipation, diarrhea, nausea and vomiting.  Skin: Negative for rash.    The problem list and medications were reviewed and updated by myself where necessary and exist elsewhere in the encounter.   OBJECTIVE:  BP 117/77 (BP Location: Right Arm, Patient Position: Sitting, Cuff Size: Normal)   Pulse 72   Temp 97.8 F (36.6 C) (Oral)   Resp 18   Ht 5\' 8"  (1.727 m)   Wt 166 lb 3.2 oz (75.4 kg)   SpO2 100%   BMI 25.27 kg/m   Wt Readings from Last 3 Encounters:  06/25/16 166 lb 3.2 oz (75.4 kg)  06/14/16 166 lb 3.2 oz (75.4 kg)  04/04/16 179 lb (81.2 kg)   Temp Readings from Last 3 Encounters:  06/25/16 97.8 F (36.6 C) (Oral)  06/14/16 98.8 F (37.1 C) (Oral)  04/04/16 98.8 F (37.1 C) (Oral)   BP Readings from Last 3 Encounters:  06/25/16 117/77  06/14/16 121/77  04/04/16 132/70   Pulse Readings from Last 3 Encounters:  06/25/16 72  06/14/16 (!) 110  04/04/16 82   Lab Results  Component Value Date   WBC 7.9 06/14/2016   HGB 15.6 06/14/2016   HCT 44.1 06/14/2016   MCV 87.1 06/14/2016   PLT 254 04/04/2016   Lab Results  Component Value Date   CREATININE 0.97 06/14/2016   Physical  Exam  Constitutional: He appears well-developed. He is active and cooperative.  Non-toxic appearance.  Cardiovascular: Normal rate.   Pulmonary/Chest: Effort normal. No tachypnea.  Abdominal: Soft. Normal appearance and bowel sounds are normal. He exhibits no distension and no mass. There is no tenderness. There is no rigidity, no rebound, no guarding and no CVA tenderness. No hernia.  Neurological: He is alert.  Skin: Skin is warm and dry. He is not diaphoretic. No pallor.  Vitals reviewed.   No results found for this or any previous visit (from the past 72 hour(s)).  No results found.  ASSESSMENT AND PLAN:  James Baird was seen today for abdominal pain and follow-up.  Diagnoses and all orders for this visit:  Enteritis due to Clostridium difficile: He is certainly moving in the right direction and his pulse is down 40 points. Will hold the course and check his stool again once he has completed metronidazole.  -     GI Profile, Stool, PCR    The patient is advised to call or return to clinic if he does not see an improvement in symptoms, or to seek the care of the closest emergency department if he worsens with the above plan.  Deliah BostonMichael Clark, MHS, PA-C [facility] 06/25/2016 5:09 PM

## 2016-06-25 NOTE — Patient Instructions (Addendum)
Complete your medication, continue to avoid all alcohol, and then collect a final stool sample so we can ensure that the medication has worked.     IF you received an x-ray today, [facility]. Please contact [facility] at [phone] with questions or concerns regarding your invoice.   IF you received labwork today, you will receive an invoice from [facility]. Please contact [facility] at [phone] with questions or concerns regarding your invoice.   Our billing staff will not be able to assist you with questions regarding bills from these companies.  You will be contacted with the lab results as soon as they are available. The fastest way to get your results is to activate your My Chart account. Instructions are located on the last page of this paperwork. If you have not heard from us regarding the results in 2 weeks, please contact this office.

## 2016-07-01 LAB — GI PROFILE, STOOL, PCR
ASTROVIRUS: NOT DETECTED
Adenovirus F 40/41: NOT DETECTED
C DIFFICILE TOXIN A/B: NOT DETECTED
CAMPYLOBACTER: NOT DETECTED
CYCLOSPORA CAYETANENSIS: NOT DETECTED
Cryptosporidium: NOT DETECTED
ENTAMOEBA HISTOLYTICA: NOT DETECTED
ENTEROPATHOGENIC E COLI: NOT DETECTED
Enteroaggregative E coli: NOT DETECTED
Enterotoxigenic E coli: NOT DETECTED
GIARDIA LAMBLIA: NOT DETECTED
Norovirus GI/GII: NOT DETECTED
PLESIOMONAS SHIGELLOIDES: NOT DETECTED
Rotavirus A: NOT DETECTED
SAPOVIRUS: NOT DETECTED
Salmonella: NOT DETECTED
Shiga-toxin-producing E coli: NOT DETECTED
Shigella/Enteroinvasive E coli: NOT DETECTED
VIBRIO CHOLERAE: NOT DETECTED
VIBRIO: NOT DETECTED
YERSINIA ENTEROCOLITICA: NOT DETECTED

## 2017-10-16 ENCOUNTER — Encounter: Payer: Self-pay | Admitting: Emergency Medicine

## 2017-10-16 ENCOUNTER — Ambulatory Visit: Payer: Self-pay | Admitting: Emergency Medicine

## 2017-10-16 ENCOUNTER — Other Ambulatory Visit: Payer: Self-pay

## 2017-10-16 VITALS — BP 108/67 | HR 69 | Temp 98.6°F | Resp 16 | Ht 66.5 in | Wt 200.4 lb

## 2017-10-16 DIAGNOSIS — R1013 Epigastric pain: Secondary | ICD-10-CM | POA: Insufficient documentation

## 2017-10-16 DIAGNOSIS — K2900 Acute gastritis without bleeding: Secondary | ICD-10-CM

## 2017-10-16 MED ORDER — RANITIDINE HCL 300 MG PO TABS: 300.0000 mg | ORAL_TABLET | Freq: Every day | ORAL | 1 refills | Status: AC

## 2017-10-16 MED ORDER — OMEPRAZOLE 40 MG PO CPDR: 40.0000 mg | DELAYED_RELEASE_CAPSULE | Freq: Every day | ORAL | 3 refills | Status: AC

## 2017-10-16 NOTE — Patient Instructions (Addendum)
If you have lab work done today you will be contacted with your lab results within the next 2 weeks.  If you have not heard from [place] then please contact us. The fastest way to get your results is to register for My Chart.   IF you received an x-ray today, you will receive an invoice from [facility]. Please contact [facility] at [phone] with questions or concerns regarding your invoice.   IF you received labwork today, you will receive an invoice from [facility]. Please contact [facility] at [phone] with questions or concerns regarding your invoice.   Our billing staff will not be able to assist you with questions regarding bills from these companies.  You will be contacted with the lab results as soon as they are available. The fastest way to get your results is to activate your My Chart account. Instructions are located on the last page of this paperwork. If you have not heard from [place] regarding the results in 2 weeks, please contact this office.     Heartburn Heartburn is a type of pain or discomfort that can happen in the throat or chest. It is often described as a burning pain. It may also cause a bad taste in the mouth. Heartburn may feel worse when you lie down or bend over. It may be caused by stomach contents that move back up (reflux) into the tube that connects the mouth with the stomach (esophagus). Follow these instructions at home: Take these actions to lessen your discomfort and to help avoid problems. Diet  Follow a diet as told by your doctor. You may need to avoid foods and drinks such as: ? Coffee and tea (with or without caffeine). ? Drinks that contain alcohol. ? Energy drinks and sports drinks. ? Carbonated drinks or sodas. ? Chocolate and cocoa. ? Peppermint and mint flavorings. ? Garlic and onions. ? Horseradish. ? Spicy and acidic foods, such as peppers, chili powder, curry powder, vinegar, hot sauces, and BBQ sauce. ? Citrus fruit  juices and citrus fruits, such as oranges, lemons, and limes. ? Tomato-based foods, such as red sauce, chili, salsa, and pizza with red sauce. ? Fried and fatty foods, such as donuts, french fries, potato chips, and high-fat dressings. ? High-fat meats, such as hot dogs, rib eye steak, sausage, ham, and bacon. ? High-fat dairy items, such as whole milk, butter, and cream cheese.  Eat small meals often. Avoid eating large meals.  Avoid drinking large amounts of liquid with your meals.  Avoid eating meals during the 2-3 hours before bedtime.  Avoid lying down right after you eat.  Do not exercise right after you eat. General instructions  Pay attention to any changes in your symptoms.  Take over-the-counter and prescription medicines only as told by your doctor. Do not take aspirin, ibuprofen, or other NSAIDs unless your doctor says it is okay.  Do not use any tobacco products, including cigarettes, chewing tobacco, and e-cigarettes. If you need help quitting, ask your doctor.  Wear loose clothes. Do not wear anything tight around your waist.  Raise (elevate) the head of your bed about 6 inches (15 cm).  Try to lower your stress. If you need help doing this, ask your doctor.  If you are overweight, lose an amount of weight that is healthy for you. Ask your doctor about a safe weight loss goal.  Keep all follow-up visits as told by your doctor. This is important. Contact a doctor if:  You have new symptoms.  You lose weight and you do not know why it is happening.  You have trouble swallowing, or it hurts to swallow.  You have wheezing or a cough that keeps happening.  Your symptoms do not get better with treatment.  You have heartburn often for more than two weeks. Get help right away if:  You have pain in your arms, neck, jaw, teeth, or back.  You feel sweaty, dizzy, or light-headed.  You have chest pain or shortness of breath.  You throw up (vomit) and your throw  up looks like blood or coffee grounds.  Your poop (stool) is bloody or black. This information is not intended to replace advice given to you by your health care provider. Make sure you discuss any questions you have with your health care provider. Document Released: 09/13/2010 Document Revised: 06/09/2015 Document Reviewed: 04/28/2014 Elsevier Interactive Patient Education  [employer].

## 2017-10-16 NOTE — Assessment & Plan Note (Signed)
Clinically stable.  No red flag signs or symptoms.  Stable vital signs.  Benign abdominal exam.  We will treat the symptoms and follow-up in 4 weeks.  If no better will refer to GI for possible upper endoscopy.  In the meantime we will start patient on 40 mg of omeprazole in the morning and 300 mg of Zantac at bedtime for the next 4 weeks.

## 2017-10-16 NOTE — Progress Notes (Signed)
James Baird 22 y.o.   Chief Complaint  Patient presents with  . Abdominal Pain    with bloating x 6 months - per patient bowel movements are fine    HISTORY OF PRESENT ILLNESS: This is a 22 y.o. male complaining of epigastric pain with bloating and heartburn for the past 6 months.  Intermittent burning pain worse when eating.  Denies nausea or vomiting.  Has daily bowel movements but at times very soft.  No melena.  No rectal bleeding.  Denies fever or chills.  Denies any other associated significant symptoms.  HPI   Prior to Admission medications   Medication Sig Start Date End Date Taking? Authorizing Provider  bismuth subsalicylate (PEPTO BISMOL) 262 MG/15ML suspension Take 30 mLs by mouth every 6 (six) hours as needed.    [provider]  loperamide (IMODIUM A-D) 2 MG tablet Take 1-2 tablets (2-4 mg total) by mouth 4 (four) times daily as needed for diarrhea or loose stools. Patient not taking: Reported on 06/25/2016 06/14/16   Ofilia Neas, PA-C  omeprazole (PRILOSEC) 40 MG capsule Take 1 capsule (40 mg total) by mouth daily. 10/16/17   Georgina Quint, MD  OVER THE COUNTER MEDICATION OTC antacid    [provider]  ranitidine (ZANTAC) 300 MG tablet Take 1 tablet (300 mg total) by mouth at bedtime for 14 days. 10/16/17 10/30/17  Georgina Quint, MD    No Known Allergies  Patient Active Problem List   Diagnosis Date Noted  . Epigastric pain 10/16/2017  . Acute gastritis without hemorrhage 10/16/2017  . Gastroesophageal reflux disease without esophagitis 02/25/2016    History reviewed. No pertinent past medical history.  History reviewed. No pertinent surgical history.  Social History   Socioeconomic History  . Marital status: Single    Spouse name: Not on file  . Number of children: Not on file  . Years of education: Not on file  . Highest education level: Not on file  Occupational History  . Not on file  Social Needs  . Financial  resource strain: Not on file  . Food insecurity:    Worry: Not on file    Inability: Not on file  . Transportation needs:    Medical: Not on file    Non-medical: Not on file  Tobacco Use  . Smoking status: Never Smoker  . Smokeless tobacco: Never Used  Substance and Sexual Activity  . Alcohol use: Not on file    Comment: 3-4 bottles a month(beer)  . Drug use: No  . Sexual activity: Not on file  Lifestyle  . Physical activity:    Days per week: Not on file    Minutes per session: Not on file  . Stress: Not on file  Relationships  . Social connections:    Talks on phone: Not on file    Gets together: Not on file    Attends religious service: Not on file    Active member of club or organization: Not on file    Attends meetings of clubs or organizations: Not on file    Relationship status: Not on file  . Intimate partner violence:    Fear of current or ex partner: Not on file    Emotionally abused: Not on file    Physically abused: Not on file    Forced sexual activity: Not on file  Other Topics Concern  . Not on file  Social History Narrative  . Not on file    History  reviewed. No pertinent family history.   Review of Systems  Constitutional: Negative.  Negative for chills, fever and weight loss.  HENT: Negative.  Negative for sore throat.   Eyes: Negative.   Respiratory: Negative.  Negative for cough and shortness of breath.   Cardiovascular: Negative.  Negative for chest pain and palpitations.  Gastrointestinal: Positive for abdominal pain and heartburn. Negative for blood in stool and melena.  Genitourinary: Negative.   Skin: Negative.  Negative for rash.  Neurological: Negative.  Negative for dizziness and headaches.  Endo/Heme/Allergies: Negative.   All other systems reviewed and are negative.   Vitals:   10/16/17 1120  BP: 108/67  Pulse: 69  Resp: 16  Temp: 98.6 F (37 C)  SpO2: 98%    Physical Exam  Constitutional: He is oriented to person,  place, and time. He appears well-developed and well-nourished.  HENT:  Head: Normocephalic and atraumatic.  Nose: Nose normal.  Mouth/Throat: Oropharynx is clear and moist.  Eyes: Pupils are equal, round, and reactive to light. Conjunctivae and EOM are normal.  Neck: Normal range of motion. Neck supple.  Cardiovascular: Normal rate and regular rhythm.  Murmur (left upper sternal border) heard. Pulmonary/Chest: Effort normal and breath sounds normal. No respiratory distress.  Abdominal: Soft. Bowel sounds are normal. He exhibits no distension and no mass. There is no tenderness. There is no rebound and no guarding.  Musculoskeletal: Normal range of motion. He exhibits no edema or tenderness.  Neurological: He is alert and oriented to person, place, and time. No sensory deficit. He exhibits normal muscle tone.  Skin: Skin is warm and dry. Capillary refill takes less than 2 seconds.  Psychiatric: He has a normal mood and affect. His behavior is normal.  Vitals reviewed.   Epigastric pain Clinically stable.  No red flag signs or symptoms.  Stable vital signs.  Benign abdominal exam.  We will treat the symptoms and follow-up in 4 weeks.  If no better will refer to GI for possible upper endoscopy.  In the meantime we will start patient on 40 mg of omeprazole in the morning and 300 mg of Zantac at bedtime for the next 4 weeks.   ASSESSMENT & PLAN: Quentin was seen today for abdominal pain.  Diagnoses and all orders for this visit:  Epigastric pain -     omeprazole (PRILOSEC) 40 MG capsule; Take 1 capsule (40 mg total) by mouth daily. -     ranitidine (ZANTAC) 300 MG tablet; Take 1 tablet (300 mg total) by mouth at bedtime for 14 days.  Acute gastritis without hemorrhage, unspecified gastritis type -     omeprazole (PRILOSEC) 40 MG capsule; Take 1 capsule (40 mg total) by mouth daily. -     ranitidine (ZANTAC) 300 MG tablet; Take 1 tablet (300 mg total) by mouth at bedtime for 14  days.     Patient Instructions       If you have lab work done today you will be contacted with your lab results within the next 2 weeks.  If you have not heard from [place] then please contact us. The fastest way to get your results is to register for My Chart.   IF you received an x-ray today, you will receive an invoice from [facility]. Please contact [facility] at [phone] with questions or concerns regarding your invoice.   IF you received labwork today, you will receive an invoice from [place]. Please contact [facility] at [phone] with questions or concerns regarding your  invoice.   Our billing staff will not be able to assist you with questions regarding bills from these companies.  You will be contacted with the lab results as soon as they are available. The fastest way to get your results is to activate your My Chart account. Instructions are located on the last page of this paperwork. If you have not heard from [place] regarding the results in 2 weeks, please contact this office.     Heartburn Heartburn is a type of pain or discomfort that can happen in the throat or chest. It is often described as a burning pain. It may also cause a bad taste in the mouth. Heartburn may feel worse when you lie down or bend over. It may be caused by stomach contents that move back up (reflux) into the tube that connects the mouth with the stomach (esophagus). Follow these instructions at home: Take these actions to lessen your discomfort and to help avoid problems. Diet  Follow a diet as told by your doctor. You may need to avoid foods and drinks such as: ? Coffee and tea (with or without caffeine). ? Drinks that contain alcohol. ? Energy drinks and sports drinks. ? Carbonated drinks or sodas. ? Chocolate and cocoa. ? Peppermint and mint flavorings. ? Garlic and onions. ? Horseradish. ? Spicy and acidic foods, such as peppers, chili powder, curry powder, vinegar, hot  sauces, and BBQ sauce. ? Citrus fruit juices and citrus fruits, such as oranges, lemons, and limes. ? Tomato-based foods, such as red sauce, chili, salsa, and pizza with red sauce. ? Fried and fatty foods, such as donuts, french fries, potato chips, and high-fat dressings. ? High-fat meats, such as hot dogs, rib eye steak, sausage, ham, and bacon. ? High-fat dairy items, such as whole milk, butter, and cream cheese.  Eat small meals often. Avoid eating large meals.  Avoid drinking large amounts of liquid with your meals.  Avoid eating meals during the 2-3 hours before bedtime.  Avoid lying down right after you eat.  Do not exercise right after you eat. General instructions  Pay attention to any changes in your symptoms.  Take over-the-counter and prescription medicines only as told by your doctor. Do not take aspirin, ibuprofen, or other NSAIDs unless your doctor says it is okay.  Do not use any tobacco products, including cigarettes, chewing tobacco, and e-cigarettes. If you need help quitting, ask your doctor.  Wear loose clothes. Do not wear anything tight around your waist.  Raise (elevate) the head of your bed about 6 inches (15 cm).  Try to lower your stress. If you need help doing this, ask your doctor.  If you are overweight, lose an amount of weight that is healthy for you. Ask your doctor about a safe weight loss goal.  Keep all follow-up visits as told by your doctor. This is important. Contact a doctor if:  You have new symptoms.  You lose weight and you do not know why it is happening.  You have trouble swallowing, or it hurts to swallow.  You have wheezing or a cough that keeps happening.  Your symptoms do not get better with treatment.  You have heartburn often for more than two weeks. Get help right away if:  You have pain in your arms, neck, jaw, teeth, or back.  You feel sweaty, dizzy, or light-headed.  You have chest pain or shortness of  breath.  You throw up (vomit) and your throw up looks like blood  or coffee grounds.  Your poop (stool) is bloody or black. This information is not intended to replace advice given to you by your health care provider. Make sure you discuss any questions you have with your health care provider. Document Released: 09/13/2010 Document Revised: 06/09/2015 Document Reviewed: 04/28/2014 Elsevier Interactive Patient Education  2018 [employer].     Edwina Barth, MD [facility]

## 2017-11-11 ENCOUNTER — Encounter: Payer: Self-pay | Admitting: Emergency Medicine

## 2017-11-11 ENCOUNTER — Ambulatory Visit: Payer: Self-pay | Admitting: Emergency Medicine

## 2017-11-11 ENCOUNTER — Other Ambulatory Visit: Payer: Self-pay

## 2017-11-11 VITALS — BP 114/63 | HR 66 | Temp 98.3°F | Resp 16 | Ht 68.5 in | Wt 203.6 lb

## 2017-11-11 DIAGNOSIS — R208 Other disturbances of skin sensation: Secondary | ICD-10-CM

## 2017-11-11 DIAGNOSIS — R1013 Epigastric pain: Secondary | ICD-10-CM

## 2017-11-11 NOTE — Assessment & Plan Note (Signed)
Much improved.  Symptoms resolved.  Has some intermittent rectal burning of unclear etiology.  Referred to GI as needed for possible sigmoidoscopy.

## 2017-11-11 NOTE — Progress Notes (Signed)
James Baird 22 y.o.   Chief Complaint  Patient presents with  . Diarrhea    FOLLOW UP and per patient continue to have gas burning in the rectum    HISTORY OF PRESENT ILLNESS: This is a 22 y.o. male here for follow-up of epigastric pain.  Seen by me on 10/16/2017 and started on omeprazole and Zantac.  Symptoms much improved.  Asymptomatic.  Still has some minor GI symptoms: Soft nonbloody stools, increase gas, and intermittent rectal burning.  No other significant symptoms.  HPI   Prior to Admission medications   Medication Sig Start Date End Date Taking? Authorizing Provider  omeprazole (PRILOSEC) 40 MG capsule Take 1 capsule (40 mg total) by mouth daily. 10/16/17  Yes Fallou Hulbert, Eilleen Kempf, MD  bismuth subsalicylate (PEPTO BISMOL) 262 MG/15ML suspension Take 30 mLs by mouth every 6 (six) hours as needed.    [provider]  loperamide (IMODIUM A-D) 2 MG tablet Take 1-2 tablets (2-4 mg total) by mouth 4 (four) times daily as needed for diarrhea or loose stools. Patient not taking: Reported on 06/25/2016 06/14/16   Ofilia Neas, PA-C  OVER THE COUNTER MEDICATION OTC antacid    [provider]  ranitidine (ZANTAC) 300 MG tablet Take 1 tablet (300 mg total) by mouth at bedtime for 14 days. 10/16/17 10/30/17  Georgina Quint, MD    No Known Allergies  Patient Active Problem List   Diagnosis Date Noted  . Epigastric pain 10/16/2017  . Acute gastritis without hemorrhage 10/16/2017  . Gastroesophageal reflux disease without esophagitis 02/25/2016    No past medical history on file.  No past surgical history on file.  Social History   Socioeconomic History  . Marital status: Single    Spouse name: Not on file  . Number of children: Not on file  . Years of education: Not on file  . Highest education level: Not on file  Occupational History  . Not on file  Social Needs  . Financial resource strain: Not on file  . Food insecurity:    Worry: Not on  file    Inability: Not on file  . Transportation needs:    Medical: Not on file    Non-medical: Not on file  Tobacco Use  . Smoking status: Never Smoker  . Smokeless tobacco: Never Used  Substance and Sexual Activity  . Alcohol use: Not on file    Comment: 3-4 bottles a month(beer)  . Drug use: No  . Sexual activity: Not on file  Lifestyle  . Physical activity:    Days per week: Not on file    Minutes per session: Not on file  . Stress: Not on file  Relationships  . Social connections:    Talks on phone: Not on file    Gets together: Not on file    Attends religious service: Not on file    Active member of club or organization: Not on file    Attends meetings of clubs or organizations: Not on file    Relationship status: Not on file  . Intimate partner violence:    Fear of current or ex partner: Not on file    Emotionally abused: Not on file    Physically abused: Not on file    Forced sexual activity: Not on file  Other Topics Concern  . Not on file  Social History Narrative  . Not on file    No family history on file.   Review of Systems  Constitutional: Negative for chills, fever and weight loss.  HENT: Negative for sore throat.   Respiratory: Negative for cough.   Cardiovascular: Negative for chest pain and palpitations.  Gastrointestinal: Negative for abdominal pain, blood in stool, melena, nausea and vomiting.  Genitourinary: Negative.   Skin: Negative for rash.  Neurological: Negative for dizziness and headaches.  Endo/Heme/Allergies: Negative.   All other systems reviewed and are negative.  Vitals:   11/11/17 0941  BP: 114/63  Pulse: 66  Resp: 16  Temp: 98.3 F (36.8 C)  SpO2: 100%     Physical Exam  Constitutional: He is oriented to person, place, and time. He appears well-developed and well-nourished.  HENT:  Head: Normocephalic and atraumatic.  Eyes: Pupils are equal, round, and reactive to light. EOM are normal.  Neck: Normal range of  motion.  Cardiovascular: Normal rate and regular rhythm.  Pulmonary/Chest: Effort normal and breath sounds normal.  Abdominal: Soft. Bowel sounds are normal. He exhibits no distension. There is no tenderness.  Genitourinary: Rectum normal. Rectal exam shows no external hemorrhoid and no fissure.  Musculoskeletal: Normal range of motion.  Neurological: He is alert and oriented to person, place, and time. No sensory deficit. He exhibits normal muscle tone.  Skin: Skin is warm and dry. Capillary refill takes less than 2 seconds.  Psychiatric: He has a normal mood and affect. His behavior is normal.  Vitals reviewed.    ASSESSMENT & PLAN: Epigastric pain Much improved.  Symptoms resolved.  Has some intermittent rectal burning of unclear etiology.  Referred to GI as needed for possible sigmoidoscopy.  Payton was seen today for diarrhea.  Diagnoses and all orders for this visit:  Epigastric pain Comments: Resolved Orders: -     Ambulatory referral to Gastroenterology  Rectal burning -     Ambulatory referral to Gastroenterology    Patient Instructions       If you have lab work done today you will be contacted with your lab results within the next 2 weeks.  If you have not heard from [place] then please contact us. The fastest way to get your results is to register for My Chart.   IF you received an x-ray today, you will receive an invoice from [facility]. Please contact [facility] at [phone] with questions or concerns regarding your invoice.   IF you received labwork today, you will receive an invoice from [place]. Please contact [facility] at [phone] with questions or concerns regarding your invoice.   Our billing staff will not be able to assist you with questions regarding bills from these companies.  You will be contacted with the lab results as soon as they are available. The fastest way to get your results is to activate your My Chart account.  Instructions are located on the last page of this paperwork. If you have not heard from [place] regarding the results in 2 weeks, please contact this office.     Proctalgia Fugax Proctalgia fugax is a condition that involves very short episodes of intense pain in the rectum. The rectum is the last part of the large intestine. The pain can last from seconds to minutes. Episodes often occur during the night and awaken the person from sleep. This condition is not a sign of cancer, but your health care provider may want to rule out a number of other conditions. What are the causes? The cause of this condition is not known. One possible cause may be spasm of the pelvic muscles or of  the lowest part of the large intestine. What are the signs or symptoms? The only symptom of this condition is rectal pain.  The pain may be intense or severe.  The pain may last for only a few seconds or it may last up to 30 minutes.  The pain may occur at night and wake you up from sleep.  How is this diagnosed? This condition may be diagnosed by ruling out other problems that could cause the pain. Diagnosis may include:  Medical history and physical exam.  Various tests, such as: ? Anoscopy. In this test, a lighted scope is put into the rectum to look for abnormalities. ? Barium enema. In this test, X-rays are taken after a white chalky substance called barium is put into the colon. The barium makes it easier to see problems because it shows up well on the X-rays. ? Blood tests to rule out infections or other problems.  How is this treated? There is no specific treatment to cure this condition. Treatment options may include:  Medicines.  Warm baths.  Relaxation techniques.  Gentle massage of the painful area.  Biofeedback.  Follow these instructions at home:  Take over-the-counter and prescription medicines only as told by your health care provider.  Follow instructions from your health care provider  about diet.  Follow instructions from your health care provider about rest and physical activity.  Try warm baths, massaging the area, or progressive relaxation techniques as told by your health care provider.  Keep all follow-up visits as told by your health care provider. This is important. Contact a health care provider if:  You develop new symptoms.  Your pain does not get better as soon as it usually does. This information is not intended to replace advice given to you by your health care provider. Make sure you discuss any questions you have with your health care provider. Document Released: 09/26/2000 Document Revised: 06/09/2015 Document Reviewed: 03/29/2014 Elsevier Interactive Patient Education  2018 [employer].      Edwina Barth, MD [facility]

## 2017-11-11 NOTE — Patient Instructions (Addendum)
If you have lab work done today you will be contacted with your lab results within the next 2 weeks.  If you have not heard from [place] then please contact us. The fastest way to get your results is to register for My Chart.   IF you received an x-ray today, you will receive an invoice from [facility]. Please contact [facility] at [phone] with questions or concerns regarding your invoice.   IF you received labwork today, you will receive an invoice from [place]. Please contact [facility] at [phone] with questions or concerns regarding your invoice.   Our billing staff will not be able to assist you with questions regarding bills from these companies.  You will be contacted with the lab results as soon as they are available. The fastest way to get your results is to activate your My Chart account. Instructions are located on the last page of this paperwork. If you have not heard from [place] regarding the results in 2 weeks, please contact this office.     Proctalgia Fugax Proctalgia fugax is a condition that involves very short episodes of intense pain in the rectum. The rectum is the last part of the large intestine. The pain can last from seconds to minutes. Episodes often occur during the night and awaken the person from sleep. This condition is not a sign of cancer, but your health care provider may want to rule out a number of other conditions. What are the causes? The cause of this condition is not known. One possible cause may be spasm of the pelvic muscles or of the lowest part of the large intestine. What are the signs or symptoms? The only symptom of this condition is rectal pain.  The pain may be intense or severe.  The pain may last for only a few seconds or it may last up to 30 minutes.  The pain may occur at night and wake you up from sleep.  How is this diagnosed? This condition may be diagnosed by ruling out other problems that could cause the  pain. Diagnosis may include:  Medical history and physical exam.  Various tests, such as: ? Anoscopy. In this test, a lighted scope is put into the rectum to look for abnormalities. ? Barium enema. In this test, X-rays are taken after a white chalky substance called barium is put into the colon. The barium makes it easier to see problems because it shows up well on the X-rays. ? Blood tests to rule out infections or other problems.  How is this treated? There is no specific treatment to cure this condition. Treatment options may include:  Medicines.  Warm baths.  Relaxation techniques.  Gentle massage of the painful area.  Biofeedback.  Follow these instructions at home:  Take over-the-counter and prescription medicines only as told by your health care provider.  Follow instructions from your health care provider about diet.  Follow instructions from your health care provider about rest and physical activity.  Try warm baths, massaging the area, or progressive relaxation techniques as told by your health care provider.  Keep all follow-up visits as told by your health care provider. This is important. Contact a health care provider if:  You develop new symptoms.  Your pain does not get better as soon as it usually does. This information is not intended to replace advice given to you by your health care provider. Make sure you discuss any questions you have with your health care  provider. Document Released: 09/26/2000 Document Revised: 06/09/2015 Document Reviewed: 03/29/2014 Elsevier Interactive Patient Education  [employer].

## 2017-11-12 ENCOUNTER — Encounter: Payer: Self-pay | Admitting: Gastroenterology

## 2017-12-09 ENCOUNTER — Ambulatory Visit: Payer: Self-pay | Admitting: Gastroenterology

## 2018-01-23 ENCOUNTER — Ambulatory Visit: Payer: Self-pay | Admitting: *Deleted

## 2018-01-23 NOTE — Telephone Encounter (Signed)
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                   Reason for Disposition . [1] Coughed up blood AND [2] > 1 tablespoon (15 ml) (Exception: blood-tinged sputum)  Answer Assessment - Initial Assessment Questions 1. ONSET: "When did the cough begin?"      01/19/18 2. SEVERITY: "How bad is the cough today?"      Moderate to severe 3. RESPIRATORY DISTRESS: "Describe your breathing."      ok 4. FEVER: "Do you have a fever?" If so, ask: "What is your temperature, how was it measured, and when did it start?"     no 5. SPUTUM: "Describe the color of your sputum" (clear, white, yellow, green)     Thick yellow 6. HEMOPTYSIS: "Are you coughing up any blood?" If so ask: "How much?" (flecks, streaks, tablespoons, etc.)     Teaspoon of blood 7. CARDIAC HISTORY: "Do you have any history of heart disease?" (e.g., heart attack, congestive heart failure)      no 8. LUNG HISTORY: "Do you have any history of lung disease?"  (e.g., pulmonary embolus, asthma, emphysema)     Inactive TB in 2016; got treated in hospital 9. PE RISK FACTORS: "Do you have a history of blood clots?" (or: recent major surgery, recent prolonged travel, bedridden)     no 10. OTHER SYMPTOMS: "Do you have any other symptoms?" (e.g., runny nose, wheezing, chest pain)       no 11. PREGNANCY: "Is there any  chance you are pregnant?" "When was your last menstrual period?"       n\a 12. TRAVEL: "Have you traveled out of the country in the last month?" (e.g., travel history, exposures)       no  Protocols used: COUGH - ACUTE PRODUCTIVE-A-AH

## 2018-01-24 ENCOUNTER — Encounter: Payer: Self-pay | Admitting: Emergency Medicine

## 2018-01-24 ENCOUNTER — Other Ambulatory Visit: Payer: Self-pay

## 2018-01-24 ENCOUNTER — Ambulatory Visit: Payer: Self-pay | Admitting: Emergency Medicine

## 2018-01-24 ENCOUNTER — Ambulatory Visit (INDEPENDENT_AMBULATORY_CARE_PROVIDER_SITE_OTHER): Payer: Self-pay

## 2018-01-24 VITALS — BP 124/73 | HR 97 | Temp 98.4°F | Resp 16 | Ht 68.0 in | Wt 208.6 lb

## 2018-01-24 DIAGNOSIS — R042 Hemoptysis: Secondary | ICD-10-CM

## 2018-01-24 DIAGNOSIS — R05 Cough: Secondary | ICD-10-CM

## 2018-01-24 DIAGNOSIS — R059 Cough, unspecified: Secondary | ICD-10-CM

## 2018-01-24 DIAGNOSIS — J22 Unspecified acute lower respiratory infection: Secondary | ICD-10-CM

## 2018-01-24 MED ORDER — PREDNISONE 20 MG PO TABS: 40.0000 mg | ORAL_TABLET | Freq: Every day | ORAL | 0 refills | Status: AC

## 2018-01-24 MED ORDER — DOXYCYCLINE HYCLATE 100 MG PO TABS: 100.0000 mg | ORAL_TABLET | Freq: Two times a day (BID) | ORAL | 0 refills | Status: AC

## 2018-01-24 NOTE — Patient Instructions (Addendum)
     If you have lab work done today you will be contacted with your lab results within the next 2 weeks.  If you have not heard from [place] then please contact us. The fastest way to get your results is to register for My Chart.   IF you received an x-ray today, you will receive an invoice from [facility]. Please contact [facility] at [phone] with questions or concerns regarding your invoice.   IF you received labwork today, you will receive an invoice from [place]. Please contact [facility] at [phone] with questions or concerns regarding your invoice.   Our billing staff will not be able to assist you with questions regarding bills from these companies.  You will be contacted with the lab results as soon as they are available. The fastest way to get your results is to activate your My Chart account. Instructions are located on the last page of this paperwork. If you have not heard from [place] regarding the results in 2 weeks, please contact this office.     Hemoptysis  Hemoptysis is when you cough up blood. It can be mild or serious. If it is mild, you may cough up bloody spit and mucus (sputum). If you cough up 1-2 cups (240-480 mL) of blood within 24 hours (massive hemoptysis), it is an emergency. If you cough up blood, it is important to go and see your doctor. Follow these instructions at home:  Watch your condition for any changes.  Take over-the-counter and prescription medicines only as told by your doctor.  If you were prescribed an antibiotic medicine, take it as told by your doctor. Do not stop taking the antibiotic even if you start to feel better.  Go back to your normal activities as told by your doctor. Ask your doctor what activities are safe for you to do.  Do not use any products that contain nicotine or tobacco. These include cigarettes and e-cigarettes. If you need help quitting, ask your doctor.  Keep all follow-up visits as told by your  doctor. This is important. Contact a doctor if:  You have a fever.  You cough up bloody spit and mucus. Get help right away if:  You cough up fresh blood or blood clots.  You have trouble breathing.  You have chest pain. This information is not intended to replace advice given to you by your health care provider. Make sure you discuss any questions you have with your health care provider. Document Released: 12/19/2011 Document Revised: 09/30/2015 Document Reviewed: 09/30/2015 Elsevier Interactive Patient Education  [employer].

## 2018-01-24 NOTE — Progress Notes (Signed)
James Baird 23 y.o.   Chief Complaint  Patient presents with  . Cough    x 6 days - per patient spitting up bloody mucus in the morning    HISTORY OF PRESENT ILLNESS: This is a 23 y.o. male complaining of cough for the last 6 days.  In the morning he notices blood in the phlegm.  Denies any chest pain or difficulty breathing.  No fever or chills.  No recent traveling.  No other significant symptoms. Medical record review shows that patient's QuantiFERON test tested positive on 08/02/2014.  Normal chest x-ray and asymptomatic.  Was referred to infectious diseases clinic and was treated for latent TB for 6 months.  No complications. HPI   Prior to Admission medications   Medication Sig Start Date End Date Taking? Authorizing Provider  bismuth subsalicylate (PEPTO BISMOL) 262 MG/15ML suspension Take 30 mLs by mouth every 6 (six) hours as needed.    [provider]  loperamide (IMODIUM A-D) 2 MG tablet Take 1-2 tablets (2-4 mg total) by mouth 4 (four) times daily as needed for diarrhea or loose stools. Patient not taking: Reported on 01/24/2018 06/14/16   Ofilia Neas, PA-C  omeprazole (PRILOSEC) 40 MG capsule Take 1 capsule (40 mg total) by mouth daily. Patient not taking: Reported on 01/24/2018 10/16/17   Georgina Quint, MD  OVER THE COUNTER MEDICATION OTC antacid    [provider]  ranitidine (ZANTAC) 300 MG tablet Take 1 tablet (300 mg total) by mouth at bedtime for 14 days. 10/16/17 10/30/17  Georgina Quint, MD    No Known Allergies  Patient Active Problem List   Diagnosis Date Noted  . Epigastric pain 10/16/2017  . Acute gastritis without hemorrhage 10/16/2017  . Gastroesophageal reflux disease without esophagitis 02/25/2016    No past medical history on file.  No past surgical history on file.  Social History   Socioeconomic History  . Marital status: Single    Spouse name: Not on file  . Number of children: Not on file  . Years of  education: Not on file  . Highest education level: Not on file  Occupational History  . Not on file  Social Needs  . Financial resource strain: Not on file  . Food insecurity:    Worry: Not on file    Inability: Not on file  . Transportation needs:    Medical: Not on file    Non-medical: Not on file  Tobacco Use  . Smoking status: Never Smoker  . Smokeless tobacco: Never Used  Substance and Sexual Activity  . Alcohol use: Not on file    Comment: 3-4 bottles a month(beer)  . Drug use: No  . Sexual activity: Not on file  Lifestyle  . Physical activity:    Days per week: Not on file    Minutes per session: Not on file  . Stress: Not on file  Relationships  . Social connections:    Talks on phone: Not on file    Gets together: Not on file    Attends religious service: Not on file    Active member of club or organization: Not on file    Attends meetings of clubs or organizations: Not on file    Relationship status: Not on file  . Intimate partner violence:    Fear of current or ex partner: Not on file    Emotionally abused: Not on file    Physically abused: Not on file    Forced  sexual activity: Not on file  Other Topics Concern  . Not on file  Social History Narrative  . Not on file    No family history on file.   Review of Systems  Constitutional: Negative.  Negative for chills and fever.  HENT: Negative.  Negative for sore throat.   Eyes: Negative.   Respiratory: Positive for cough and hemoptysis. Negative for shortness of breath and wheezing.   Cardiovascular: Negative.  Negative for chest pain and palpitations.  Gastrointestinal: Negative.  Negative for abdominal pain, diarrhea, nausea and vomiting.  Genitourinary: Negative.  Negative for dysuria.  Musculoskeletal: Negative.   Skin: Negative.  Negative for rash.  Neurological: Negative.  Negative for dizziness and headaches.  Endo/Heme/Allergies: Negative.   All other systems reviewed and are  negative.   Vitals:   01/24/18 1536  BP: 124/73  Pulse: 97  Resp: 16  Temp: 98.4 F (36.9 C)  SpO2: 100%    Physical Exam Vitals signs reviewed.  Constitutional:      Appearance: Normal appearance.  HENT:     Head: Normocephalic and atraumatic.     Nose: Nose normal.     Mouth/Throat:     Mouth: Mucous membranes are moist.     Pharynx: Oropharynx is clear.  Eyes:     Extraocular Movements: Extraocular movements intact.     Conjunctiva/sclera: Conjunctivae normal.     Pupils: Pupils are equal, round, and reactive to light.  Neck:     Musculoskeletal: Normal range of motion and neck supple.  Cardiovascular:     Rate and Rhythm: Normal rate and regular rhythm.     Heart sounds: Normal heart sounds.  Pulmonary:     Effort: Pulmonary effort is normal.     Breath sounds: Normal breath sounds.  Abdominal:     General: There is no distension.     Palpations: Abdomen is soft.     Tenderness: There is no abdominal tenderness.  Musculoskeletal: Normal range of motion.  Lymphadenopathy:     Cervical: No cervical adenopathy.  Skin:    General: Skin is warm and dry.     Capillary Refill: Capillary refill takes less than 2 seconds.  Neurological:     General: No focal deficit present.     Mental Status: He is alert and oriented to person, place, and time.    Dg Chest 2 View  Result Date: 01/24/2018 CLINICAL DATA:  Cough and hemoptysis EXAM: CHEST - 2 VIEW COMPARISON:  August 02, 2014 FINDINGS: There is no edema or consolidation. The heart size and pulmonary vascularity are normal. No adenopathy. There is mild midthoracic dextroscoliosis. IMPRESSION: No edema or consolidation. Electronically Signed   By: Bretta BangWilliam  Woodruff III M.D.   On: 01/24/2018 16:15     ASSESSMENT & PLAN: [name] was seen today for cough.  Diagnoses and all orders for this visit:  Cough -     DG Chest 2 View; Future  Hemoptysis -     DG Chest 2 View; Future -     predniSONE (DELTASONE) 20 MG tablet;  Take 2 tablets (40 mg total) by mouth daily with breakfast for 5 days.  Lower respiratory infection -     doxycycline (VIBRA-TABS) 100 MG tablet; Take 1 tablet (100 mg total) by mouth 2 (two) times daily.    Patient Instructions       If you have lab work done today you will be contacted with your lab results within the next 2 weeks.  If you  have not heard from us then please contact us. The fastest way to get your results is to register for My Chart.   IF you received an x-ray today, you will [place] an invoice from [facility]. Please contact [facility] at [phone] with questions or concerns regarding your invoice.   IF you received labwork today, you will receive an invoice from [facility]. Please contact [facility] at [phone] with questions or concerns regarding your invoice.   Our billing staff will not be able to assist you with questions regarding bills from these companies.  You will be contacted with the lab results as soon as they are available. The fastest way to get your results is to activate your My Chart account. Instructions are located on the last page of this paperwork. If you have not heard from us regarding the results in 2 weeks, please contact this office.     Hemoptysis  Hemoptysis is when you cough up blood. It can be mild or serious. If it is mild, you may cough up bloody spit and mucus (sputum). If you cough up 1-2 cups (240-480 mL) of blood within 24 hours (massive hemoptysis), it is an emergency. If you cough up blood, it is important to go and see your doctor. Follow these instructions at home:  Watch your condition for any changes.  Take over-the-counter and prescription medicines only as told by your doctor.  If you were prescribed an antibiotic medicine, take it as told by your doctor. Do not stop taking the antibiotic even if you start to feel better.  Go back to your normal activities as told by your doctor. Ask your  doctor what activities are safe for you to do.  Do not use any products that contain nicotine or tobacco. These include cigarettes and e-cigarettes. If you need help quitting, ask your doctor.  Keep all follow-up visits as told by your doctor. This is important. Contact a doctor if:  You have a fever.  You cough up bloody spit and mucus. Get help right away if:  You cough up fresh blood or blood clots.  You have trouble breathing.  You have chest pain. This information is not intended to replace advice given to you by your health care provider. Make sure you discuss any questions you have with your health care provider. Document Released: 12/19/2011 Document Revised: 09/30/2015 Document Reviewed: 09/30/2015 Elsevier Interactive Patient Education  2019 [employer]      Edwina BarthMiguel Kalvin Buss, MD [facility]

## 2018-01-24 NOTE — Telephone Encounter (Signed)
I'll see him today.Thanks.

## 2018-01-24 NOTE — Telephone Encounter (Signed)
Pt has appointment 01/24/2018 at 320

## 2020-08-14 IMAGING — DX DG CHEST 2V
2 series · 2 of 2 positions shown · non-contrast
Comparison: August 02, 2014

CLINICAL DATA: Cough and hemoptysis

EXAM:
CHEST - 2 VIEW

[chest pa]
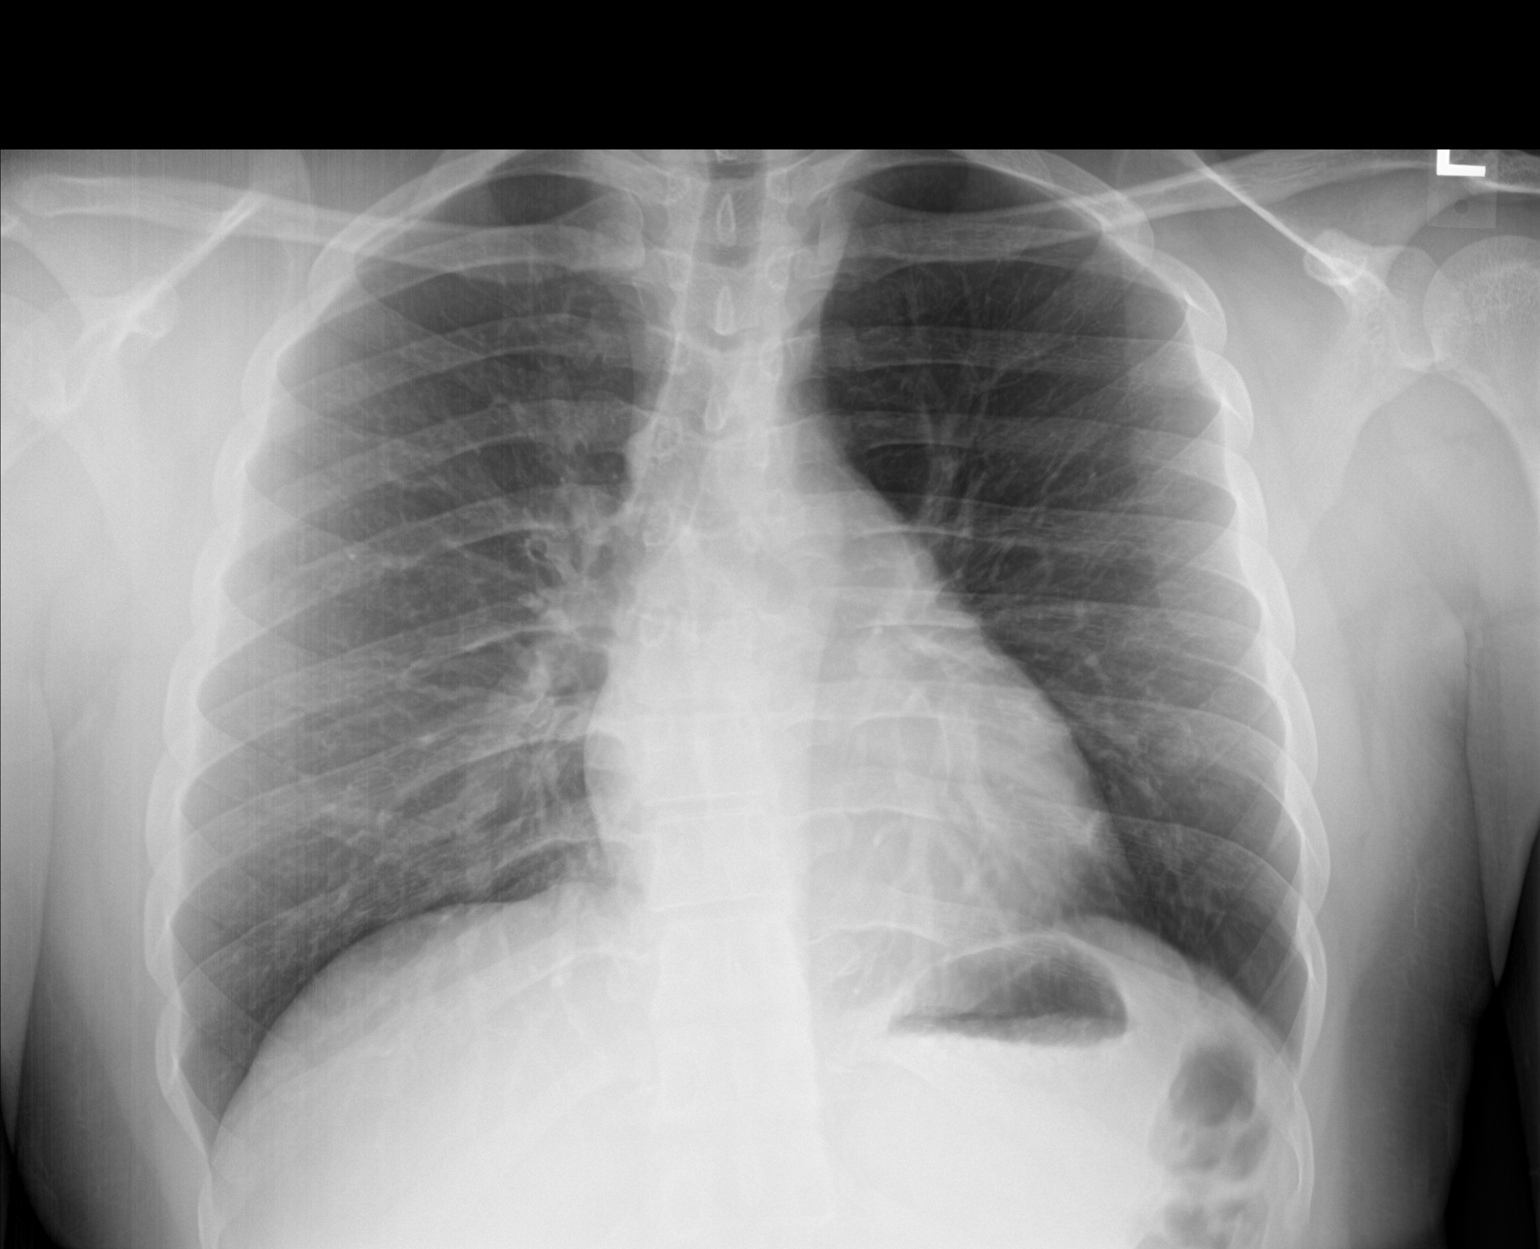

[chest lat]
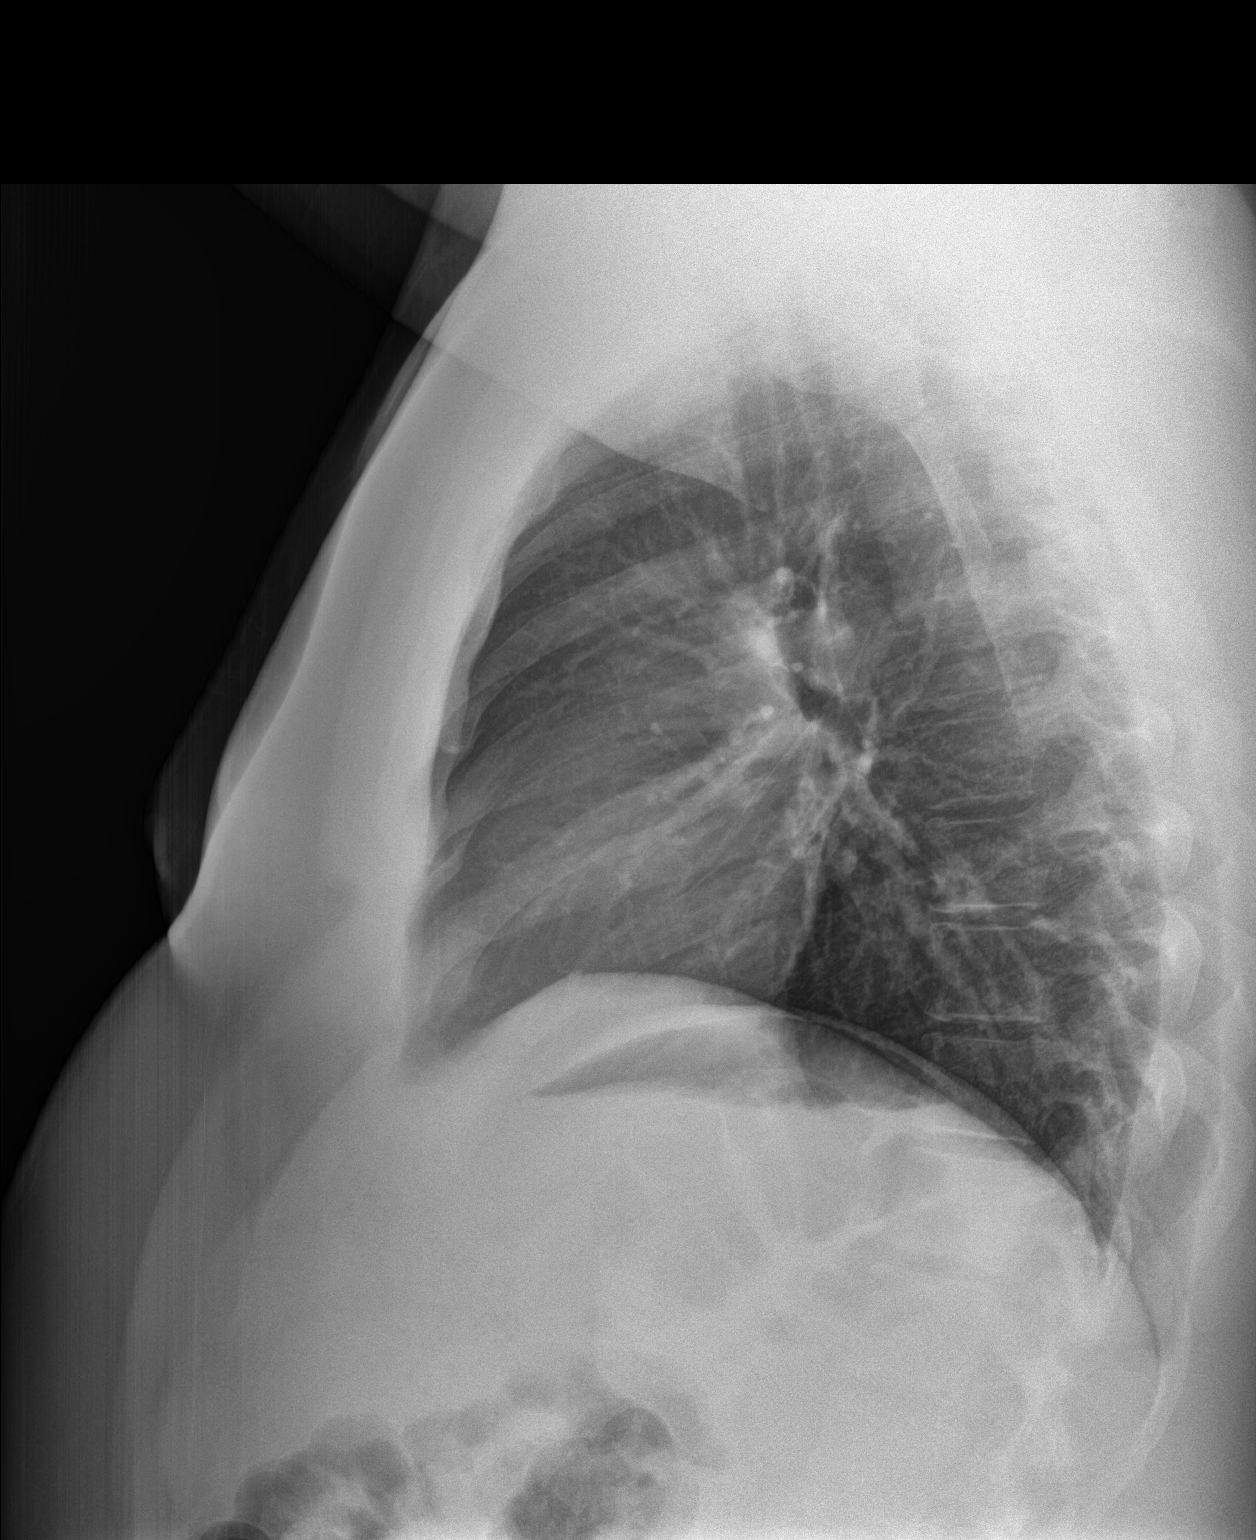

[2 of 2 positions shown; findings below may reference images not displayed]

FINDINGS: There is no edema or consolidation. The heart size and pulmonary
vascularity are normal. No adenopathy. There is mild midthoracic
dextroscoliosis.
IMPRESSION: No edema or consolidation.
